# Patient Record
Sex: Male | Born: 1950 | Race: White | Hispanic: No | Marital: Married | Smoking: Former smoker
Health system: Southern US, Community
[De-identification: ages and names within clinical notes are randomized; demographics above are authoritative.]

## PROBLEM LIST (undated history)

## (undated) DIAGNOSIS — C61 Malignant neoplasm of prostate: Secondary | ICD-10-CM

## (undated) DIAGNOSIS — F419 Anxiety disorder, unspecified: Secondary | ICD-10-CM

## (undated) DIAGNOSIS — G4730 Sleep apnea, unspecified: Secondary | ICD-10-CM

## (undated) DIAGNOSIS — C801 Malignant (primary) neoplasm, unspecified: Secondary | ICD-10-CM

## (undated) DIAGNOSIS — F3289 Other specified depressive episodes: Secondary | ICD-10-CM

## (undated) DIAGNOSIS — E039 Hypothyroidism, unspecified: Secondary | ICD-10-CM

## (undated) DIAGNOSIS — F329 Major depressive disorder, single episode, unspecified: Secondary | ICD-10-CM

## (undated) DIAGNOSIS — E1165 Type 2 diabetes mellitus with hyperglycemia: Secondary | ICD-10-CM

## (undated) DIAGNOSIS — M109 Gout, unspecified: Secondary | ICD-10-CM

## (undated) DIAGNOSIS — I10 Essential (primary) hypertension: Secondary | ICD-10-CM

## (undated) DIAGNOSIS — IMO0001 Reserved for inherently not codable concepts without codable children: Secondary | ICD-10-CM

## (undated) DIAGNOSIS — M1990 Unspecified osteoarthritis, unspecified site: Secondary | ICD-10-CM

## (undated) DIAGNOSIS — I2510 Atherosclerotic heart disease of native coronary artery without angina pectoris: Secondary | ICD-10-CM

## (undated) DIAGNOSIS — E785 Hyperlipidemia, unspecified: Secondary | ICD-10-CM

## (undated) HISTORY — DX: Gout, unspecified: M10.9

## (undated) HISTORY — PX: KNEE SURGERY: SHX244

## (undated) HISTORY — DX: Other specified depressive episodes: F32.89

## (undated) HISTORY — PX: OTHER SURGICAL HISTORY: SHX169

## (undated) HISTORY — DX: Type 2 diabetes mellitus with hyperglycemia: E11.65

## (undated) HISTORY — DX: Reserved for inherently not codable concepts without codable children: IMO0001

## (undated) HISTORY — DX: Atherosclerotic heart disease of native coronary artery without angina pectoris: I25.10

## (undated) HISTORY — DX: Essential (primary) hypertension: I10

## (undated) HISTORY — DX: Sleep apnea, unspecified: G47.30

## (undated) HISTORY — DX: Major depressive disorder, single episode, unspecified: F32.9

## (undated) HISTORY — DX: Hyperlipidemia, unspecified: E78.5

## (undated) HISTORY — DX: Morbid (severe) obesity due to excess calories: E66.01

---

## 1999-12-14 ENCOUNTER — Encounter (HOSPITAL_COMMUNITY): Admission: RE | Admit: 1999-12-14 | Discharge: 2000-03-13 | Payer: Self-pay | Admitting: Family Medicine

## 2000-01-31 ENCOUNTER — Encounter: Admission: RE | Admit: 2000-01-31 | Discharge: 2000-04-30 | Payer: Self-pay | Admitting: Orthopedic Surgery

## 2000-03-13 ENCOUNTER — Encounter (HOSPITAL_COMMUNITY): Admission: RE | Admit: 2000-03-13 | Discharge: 2000-06-11 | Payer: Self-pay | Admitting: Family Medicine

## 2000-06-19 ENCOUNTER — Encounter (HOSPITAL_COMMUNITY): Admission: RE | Admit: 2000-06-19 | Discharge: 2000-09-17 | Payer: Self-pay | Admitting: Family Medicine

## 2000-09-17 ENCOUNTER — Encounter (HOSPITAL_COMMUNITY): Admission: RE | Admit: 2000-09-17 | Discharge: 2000-12-16 | Payer: Self-pay | Admitting: Family Medicine

## 2002-10-07 ENCOUNTER — Encounter: Payer: Self-pay | Admitting: Family Medicine

## 2002-10-07 ENCOUNTER — Ambulatory Visit (HOSPITAL_COMMUNITY): Admission: RE | Admit: 2002-10-07 | Discharge: 2002-10-07 | Payer: Self-pay | Admitting: Family Medicine

## 2004-11-02 ENCOUNTER — Ambulatory Visit (HOSPITAL_COMMUNITY): Admission: RE | Admit: 2004-11-02 | Discharge: 2004-11-02 | Payer: Self-pay | Admitting: Surgery

## 2005-02-20 HISTORY — PX: SLEEVE GASTROPLASTY: SHX1101

## 2006-12-28 ENCOUNTER — Ambulatory Visit: Payer: Self-pay | Admitting: Gastroenterology

## 2007-01-10 ENCOUNTER — Ambulatory Visit (HOSPITAL_COMMUNITY): Admission: RE | Admit: 2007-01-10 | Discharge: 2007-01-10 | Payer: Self-pay | Admitting: Gastroenterology

## 2007-01-10 ENCOUNTER — Encounter: Payer: Self-pay | Admitting: Gastroenterology

## 2007-01-14 ENCOUNTER — Ambulatory Visit: Payer: Self-pay | Admitting: Gastroenterology

## 2007-04-15 DIAGNOSIS — F411 Generalized anxiety disorder: Secondary | ICD-10-CM | POA: Insufficient documentation

## 2007-04-15 DIAGNOSIS — F329 Major depressive disorder, single episode, unspecified: Secondary | ICD-10-CM

## 2007-04-15 DIAGNOSIS — J45909 Unspecified asthma, uncomplicated: Secondary | ICD-10-CM | POA: Insufficient documentation

## 2007-04-15 DIAGNOSIS — D126 Benign neoplasm of colon, unspecified: Secondary | ICD-10-CM

## 2007-04-15 DIAGNOSIS — E119 Type 2 diabetes mellitus without complications: Secondary | ICD-10-CM

## 2007-04-15 DIAGNOSIS — I2510 Atherosclerotic heart disease of native coronary artery without angina pectoris: Secondary | ICD-10-CM | POA: Insufficient documentation

## 2007-04-15 DIAGNOSIS — I10 Essential (primary) hypertension: Secondary | ICD-10-CM

## 2007-04-15 DIAGNOSIS — G4730 Sleep apnea, unspecified: Secondary | ICD-10-CM | POA: Insufficient documentation

## 2007-04-15 DIAGNOSIS — F3289 Other specified depressive episodes: Secondary | ICD-10-CM | POA: Insufficient documentation

## 2008-03-25 ENCOUNTER — Telehealth: Payer: Self-pay | Admitting: Gastroenterology

## 2010-05-09 ENCOUNTER — Inpatient Hospital Stay (HOSPITAL_COMMUNITY)
Admission: AD | Admit: 2010-05-09 | Discharge: 2010-05-14 | DRG: 607 | Disposition: A | Discharge status: Home / Self Care | Payer: 59 | Source: Ambulatory Visit | Attending: Family Medicine | Admitting: Family Medicine

## 2010-05-09 DIAGNOSIS — Z87891 Personal history of nicotine dependence: Secondary | ICD-10-CM

## 2010-05-09 DIAGNOSIS — Y92009 Unspecified place in unspecified non-institutional (private) residence as the place of occurrence of the external cause: Secondary | ICD-10-CM

## 2010-05-09 DIAGNOSIS — F329 Major depressive disorder, single episode, unspecified: Secondary | ICD-10-CM | POA: Diagnosis present

## 2010-05-09 DIAGNOSIS — Z79899 Other long term (current) drug therapy: Secondary | ICD-10-CM

## 2010-05-09 DIAGNOSIS — Z6841 Body Mass Index (BMI) 40.0 and over, adult: Secondary | ICD-10-CM

## 2010-05-09 DIAGNOSIS — M109 Gout, unspecified: Secondary | ICD-10-CM | POA: Diagnosis present

## 2010-05-09 DIAGNOSIS — L03119 Cellulitis of unspecified part of limb: Secondary | ICD-10-CM | POA: Diagnosis present

## 2010-05-09 DIAGNOSIS — E119 Type 2 diabetes mellitus without complications: Secondary | ICD-10-CM | POA: Diagnosis present

## 2010-05-09 DIAGNOSIS — L02419 Cutaneous abscess of limb, unspecified: Secondary | ICD-10-CM | POA: Diagnosis present

## 2010-05-09 DIAGNOSIS — F3289 Other specified depressive episodes: Secondary | ICD-10-CM | POA: Diagnosis present

## 2010-05-09 DIAGNOSIS — L723 Sebaceous cyst: Secondary | ICD-10-CM | POA: Diagnosis present

## 2010-05-09 DIAGNOSIS — L270 Generalized skin eruption due to drugs and medicaments taken internally: Principal | ICD-10-CM | POA: Diagnosis present

## 2010-05-09 DIAGNOSIS — I10 Essential (primary) hypertension: Secondary | ICD-10-CM | POA: Diagnosis present

## 2010-05-09 DIAGNOSIS — E039 Hypothyroidism, unspecified: Secondary | ICD-10-CM | POA: Diagnosis present

## 2010-05-09 DIAGNOSIS — T364X5A Adverse effect of tetracyclines, initial encounter: Secondary | ICD-10-CM | POA: Diagnosis present

## 2010-05-09 DIAGNOSIS — G4733 Obstructive sleep apnea (adult) (pediatric): Secondary | ICD-10-CM | POA: Diagnosis present

## 2010-05-09 DIAGNOSIS — Z9884 Bariatric surgery status: Secondary | ICD-10-CM

## 2010-05-09 DIAGNOSIS — I872 Venous insufficiency (chronic) (peripheral): Secondary | ICD-10-CM | POA: Diagnosis present

## 2010-05-09 LAB — URINALYSIS, ROUTINE W REFLEX MICROSCOPIC
Bilirubin Urine: NEGATIVE
Glucose, UA: 250 mg/dL — AB
Nitrite: NEGATIVE
Protein, ur: NEGATIVE mg/dL
Specific Gravity, Urine: 1.017 (ref 1.005–1.030)
Urobilinogen, UA: 0.2 mg/dL (ref 0.0–1.0)

## 2010-05-09 LAB — CBC
MCV: 95.6 fL (ref 78.0–100.0)
RBC: 4.31 MIL/uL (ref 4.22–5.81)
RDW: 13.4 % (ref 11.5–15.5)
WBC: 9.1 10*3/uL (ref 4.0–10.5)

## 2010-05-09 LAB — COMPREHENSIVE METABOLIC PANEL
ALT: 11 U/L (ref 0–53)
AST: 16 U/L (ref 0–37)
Albumin: 3.4 g/dL — ABNORMAL LOW (ref 3.5–5.2)
Alkaline Phosphatase: 49 U/L (ref 39–117)
CO2: 29 mEq/L (ref 19–32)
Chloride: 103 mEq/L (ref 96–112)
Creatinine, Ser: 0.93 mg/dL (ref 0.4–1.5)
GFR calc Af Amer: >60 mL/min (ref >60)
Glucose, Bld: 149 mg/dL — ABNORMAL HIGH (ref 70–99)
Sodium: 138 mEq/L (ref 135–145)
Total Protein: 6.7 g/dL (ref 6.0–8.3)

## 2010-05-09 LAB — LIPID PANEL
HDL: 38 mg/dL — ABNORMAL LOW (ref >39)
Total CHOL/HDL Ratio: 4.1 RATIO
Triglycerides: 264 mg/dL — ABNORMAL HIGH (ref <150)
VLDL: 53 mg/dL — ABNORMAL HIGH (ref 0–40)

## 2010-05-10 DIAGNOSIS — M7989 Other specified soft tissue disorders: Secondary | ICD-10-CM

## 2010-05-10 LAB — BASIC METABOLIC PANEL
BUN: 17 mg/dL (ref 6–23)
GFR calc non Af Amer: >60 mL/min (ref >60)
Potassium: 4.7 mEq/L (ref 3.5–5.1)
Sodium: 141 mEq/L (ref 135–145)

## 2010-05-10 LAB — CBC
Platelets: 217 10*3/uL (ref 150–400)
RDW: 13.8 % (ref 11.5–15.5)
WBC: 7.5 10*3/uL (ref 4.0–10.5)

## 2010-05-10 LAB — URINE CULTURE
Colony Count: NO GROWTH
Culture: NO GROWTH

## 2010-05-10 LAB — HEMOGLOBIN A1C: Hgb A1c MFr Bld: 7.6 % — ABNORMAL HIGH (ref <5.7)

## 2010-05-10 LAB — MAGNESIUM: Magnesium: 1.8 mg/dL (ref 1.5–2.5)

## 2010-05-10 LAB — GLUCOSE, CAPILLARY: Glucose-Capillary: 154 mg/dL — ABNORMAL HIGH (ref 70–99)

## 2010-05-10 NOTE — H&P (Signed)
NAMELAURIER, JASPERSON NO.:  [id number]  MEDICAL RECORD NO.:  [id number]           PATIENT TYPE:  I  LOCATION:  5508                         FACILITY:  [facility]  PHYSICIAN:  Vania Rea, M.D. DATE OF BIRTH:  December 27, 1950  DATE OF ADMISSION:  05/09/2010 DATE OF DISCHARGE:                             HISTORY & PHYSICAL   PRIMARY CARE PHYSICIAN:  Molly Maduro A. Reade, M.D.  CHIEF COMPLAINT:  Rash and swelling of the right lower extremity for the past 2 weeks.  HISTORY OF PRESENT ILLNESS:  This is a 60 year old morbidly obese Caucasian gentleman with a history of diabetes controlled on diet and a history of what appears to be chronic lower extremity venous stasis and swelling who reports that for the past 2 weeks, he has been having progressive red rash and swelling of the right leg spreading from his ankle up to his knee.  The foot does feel very warm, but he has not documented any fevers.  He has a couple of time experienced shaking chills at night.  He has had no cough or cold.  Eventually called his primary care physician who prescribed doxycycline which he began taking on Friday.  However, the patient has not been improving and eventually the hospitalist service was called for direct admission from his doctor's office today.  The patient also reports the onset of a rash in both arms and his back for the past 3 days which seems to coincide with the onset of the use of doxycycline.  The patient has no history of recent trauma to the leg.  He has been getting his blood sugars checked occasionally and was recently over 200, however reports that his hemoglobin A1c when is checked yearly is usually around 6.  PAST MEDICAL HISTORY: 1. Diabetes controlled on diet, morbid obesity status post gastric     stapling 4 years ago. 2. Obstructive sleep apnea, uses his brother's CPAP machine since his     is broken. 3. History of gout. 4. History of polyarthropathy due to  osteoarthritis. 5. Hypothyroidism. 6. Depression. 7. Possible benign prostatic hypertrophy. 8. Hypertension.  PAST SURGICAL HISTORY:  Includes also right knee surgery many years ago, also includes stomach stapling 4 years ago as noted above.  MEDICATIONS: 1. Vicodin 5/500 2 tablets as needed twice a day for knee pain. 2. Allopurinol 300 mg daily. 3. Indomethacin 50 mg three times daily as needed. 4. Lisinopril 10 mg daily. 5. Citalopram 20 mg daily, has not taken this for about 3 weeks. 6. Levothyroxine 200 mcg daily. 7. Doxycycline 100 mg twice daily for the past 3 days.  ALLERGIES:  No known drug allergies.  SOCIAL HISTORY:  Denies tobacco, alcohol, or illicit drug use. Discontinued smoking over 25 years ago.  Does not do much exercise.  He is married and his wife is present interview with him.  FAMILY HISTORY:  Significant for a father who had peptic ulcer disease and also died of colon cancer.  Mother also had peptic ulcer disease and also had deep venous thrombosis.  Other than this his family history is unremarkable.  REVIEW OF SYSTEMS:  Significant for a fact that his weight was as high as 530 pounds prior to his gastric bypass surgery.  Today he is down to 426 pounds.  He gives his height as 5 feet 9 inches.  The patient also gives a history of many years of nocturia.  He wakes up about every hour and a half every night to pass urine, although she says his blood sugars are always well controlled and he does not think he has an enlarged prostate. 1. He reports that he tends to have pain in many joints, particularly     the right knee. 2. He did have a colonoscopy done about 3 years ago, which he reports     as being unremarkable. 3. Other than this, complete review of systems unremarkable.  PHYSICAL EXAMINATION:  GENERAL:  Pleasant, morbidly obese Caucasian gentleman in a recliner. VITAL SIGNS:  Temperature is 97.8.  His pulse is 97, respirations 18, blood pressure  116/64, saturating 97% on room air. HEENT:  His pupils are round and equal.  His mucous membranes are pink. Anicteric.  No cervical lymphadenopathy or thyromegaly. NECK:  He does have a very thick neck.  No carotid bruits. CHEST:  Clear to auscultation bilaterally. CARDIOVASCULAR:  Regular rhythm.  No murmur heard. ABDOMEN:  Massively obese, but soft, nontender.  There is no intertriginous Candida. EXTREMITIES:  He does have arthritic deformities of the knees.  There is mild edema on both legs.  He has dorsalis pedis pulses 1+ bilaterally. The feet are warm. SKIN:  Skin of the entire right leg below the knee is erythematous.  The surface is very cobbled and there is no weeping from the leg, appears to be a combination of inflammation and venous stasis changes.  It involves more the anterior of the leg rather than the posterior of the leg.  The left leg is less swollen and there is evidence of healed scarring of the anterior leg and hyperpigmented skin changes associated with venous stasis.  He has maculopapular rash on both forearms and elbows and down his back and also involving his scalp.  There is no rash on his chest. CENTRAL NERVOUS SYSTEM:  Cranial nerves II through XII are grossly intact.  He has no focal lateralizing signs.  DATA:  Because this gentleman is a direct admit, we have no labs or radiologic studies as yet.  ASSESSMENT: 1. Cellulitis of the right lower extremity. 2. Acute allergic reaction, questionable related to doxycycline. 3. Diabetes type 2, uncontrolled by history. 4. Hypertension. 5. Chronic venous stasis. 6. Morbid obesity. 7. Obstructive sleep apnea. 8. Hypothyroidism. 9. Gout. 10.Nocturia, questionable secondary to benign prostatic hypertrophy. 11.Depression.  PLAN: 1. We will admit this gentleman for management of his cellulitis.  We     will start him on vancomycin and because of the appearance of the     leg, which is somewhat suggestive of  strep infection.  We will go     ahead and add Rocephin.  Blood cultures have already been ordered. 2. He is having an acute allergic reaction.  We will discontinue     doxycycline presumptively.  He has itching of his back and his     hands, we will give Benadryl now and then p.r.n. and we will start     him on daily Claritin. 3. We will also add Pepcid, but we will withhold steroids because of     his diabetes. 4. Since his diabetes is usually controlled on diet, we will  manage     him with a sliding scale insulin initially. 5. We will continue his hyperthyroid medication, but will withhold     NSAIDs and lisinopril until we have blood work back to assure [place]     that his renal function is normal. 6. We will consult physical therapist and occupational therapist, they     may be able to assist with management of his venous stasis. 7. We will also get Doppler ultrasound particularly of the right leg     to rule out deep venous thrombosis contributing to the swelling,     pain and redness. 8. Other plans as per orders.     Vania Rea, M.D.     LC/MEDQ  D:  05/09/2010  T:  05/09/2010  Job:  [id number]  cc:   [name], M.D.  Electronically Signed by Vania Rea M.D. on 05/10/2010 12:59:26 AM

## 2010-05-11 LAB — BASIC METABOLIC PANEL
BUN: 13 mg/dL (ref 6–23)
CO2: 26 mEq/L (ref 19–32)
Chloride: 101 mEq/L (ref 96–112)
GFR calc non Af Amer: >60 mL/min (ref >60)
Glucose, Bld: 163 mg/dL — ABNORMAL HIGH (ref 70–99)
Potassium: 4.4 mEq/L (ref 3.5–5.1)
Sodium: 136 mEq/L (ref 135–145)

## 2010-05-11 LAB — GLUCOSE, CAPILLARY: Glucose-Capillary: 160 mg/dL — ABNORMAL HIGH (ref 70–99)

## 2010-05-11 LAB — VANCOMYCIN, TROUGH: Vancomycin Tr: 20.9 ug/mL — ABNORMAL HIGH (ref 10.0–20.0)

## 2010-05-11 LAB — CBC
HCT: 38.4 % — ABNORMAL LOW (ref 39.0–52.0)
Hemoglobin: 12.8 g/dL — ABNORMAL LOW (ref 13.0–17.0)
MCHC: 33.3 g/dL (ref 30.0–36.0)
RBC: 4.01 MIL/uL — ABNORMAL LOW (ref 4.22–5.81)
WBC: 7.3 10*3/uL (ref 4.0–10.5)

## 2010-05-11 LAB — DIFFERENTIAL
Basophils Absolute: 0.1 10*3/uL (ref 0.0–0.1)
Lymphocytes Relative: 20 % (ref 12–46)
Monocytes Absolute: 0.7 10*3/uL (ref 0.1–1.0)
Neutro Abs: 4.6 10*3/uL (ref 1.7–7.7)
Neutrophils Relative %: 64 % (ref 43–77)

## 2010-05-12 LAB — COMPREHENSIVE METABOLIC PANEL
Albumin: 3.1 g/dL — ABNORMAL LOW (ref 3.5–5.2)
BUN: 12 mg/dL (ref 6–23)
Chloride: 104 mEq/L (ref 96–112)
Creatinine, Ser: 0.88 mg/dL (ref 0.4–1.5)
GFR calc non Af Amer: >60 mL/min (ref >60)
Total Bilirubin: 0.5 mg/dL (ref 0.3–1.2)

## 2010-05-12 LAB — GLUCOSE, CAPILLARY: Glucose-Capillary: 124 mg/dL — ABNORMAL HIGH (ref 70–99)

## 2010-05-12 LAB — CBC
MCH: 32 pg (ref 26.0–34.0)
MCV: 95.7 fL (ref 78.0–100.0)
Platelets: 211 10*3/uL (ref 150–400)
RBC: 4.16 MIL/uL — ABNORMAL LOW (ref 4.22–5.81)

## 2010-05-13 LAB — CBC
HCT: 40 % (ref 39.0–52.0)
MCH: 31.7 pg (ref 26.0–34.0)
MCV: 95.9 fL (ref 78.0–100.0)
Platelets: 214 10*3/uL (ref 150–400)
RBC: 4.17 MIL/uL — ABNORMAL LOW (ref 4.22–5.81)
WBC: 7.2 10*3/uL (ref 4.0–10.5)

## 2010-05-13 LAB — GLUCOSE, CAPILLARY
Glucose-Capillary: 146 mg/dL — ABNORMAL HIGH (ref 70–99)
Glucose-Capillary: 154 mg/dL — ABNORMAL HIGH (ref 70–99)
Glucose-Capillary: 149 mg/dL — ABNORMAL HIGH (ref 70–99)

## 2010-05-13 LAB — COMPREHENSIVE METABOLIC PANEL
Albumin: 3.1 g/dL — ABNORMAL LOW (ref 3.5–5.2)
Alkaline Phosphatase: 39 U/L (ref 39–117)
BUN: 10 mg/dL (ref 6–23)
Chloride: 101 mEq/L (ref 96–112)
Creatinine, Ser: 0.93 mg/dL (ref 0.4–1.5)
GFR calc non Af Amer: >60 mL/min (ref >60)
Glucose, Bld: 136 mg/dL — ABNORMAL HIGH (ref 70–99)
Potassium: 4.4 mEq/L (ref 3.5–5.1)
Total Bilirubin: 0.6 mg/dL (ref 0.3–1.2)

## 2010-05-13 LAB — WOUND CULTURE: Gram Stain: NONE SEEN

## 2010-05-14 LAB — CBC
HCT: 38.2 % — ABNORMAL LOW (ref 39.0–52.0)
Hemoglobin: 12.8 g/dL — ABNORMAL LOW (ref 13.0–17.0)
MCH: 31.8 pg (ref 26.0–34.0)
MCHC: 33.5 g/dL (ref 30.0–36.0)
MCV: 94.8 fL (ref 78.0–100.0)
RDW: 13.4 % (ref 11.5–15.5)

## 2010-05-14 LAB — GLUCOSE, CAPILLARY: Glucose-Capillary: 130 mg/dL — ABNORMAL HIGH (ref 70–99)

## 2010-05-16 LAB — CULTURE, BLOOD (ROUTINE X 2)
Culture  Setup Time: 201203200427
Culture: NO GROWTH

## 2010-05-19 NOTE — Discharge Summary (Signed)
Cameron Moran, Cameron Moran               ACCOUNT NO.:  [id number]  MEDICAL RECORD NO.:  [id number]           PATIENT TYPE:  I  LOCATION:  5508                         FACILITY:  [facility]  PHYSICIAN:  Cameron Koch, MD        DATE OF BIRTH:  1950-10-18  DATE OF ADMISSION:  05/09/2010 DATE OF DISCHARGE:                              DISCHARGE SUMMARY   DISCHARGE DIAGNOSES: 1. Possible drug-induced rash secondary to doxycycline. 2. Likely secondary infection with cellulitis of right lower     extremity. 3. Contact dermatitis/photosensitive eruption secondary doxycycline     upper extremities. 4. Milia versus back. 5. Diabetes mellitus type 2 poor control A1c 7.6-?needs meds? 6. Gout. 7. Depression. 8. Venous stasis lower extremity 9. Obstructive sleep apnea. 10.Remote history of gastric bypass surgery.  DISCHARGE MEDICATIONS:  Are as follows: 1. Keflex 500 mg 1 tablet b.i.d. for 4 days, 8 tablets given. 2. Benadryl 50 mg by mouth q.8 hourly take for 7 days, 21 tablets     given. 3. Eucerin cream one application topically as needed, quantity     sufficient for 7 days prescribed. 4. Hydroxyzine 50 mg by mouth every 6 hours as needed, quantity     sufficient given for 7 days. 5. Levothyroxine 250 mg by mouth daily before breakfast (this is an     increase in dose). 6. Loratadine 10 mg by mouth daily take for 10 days. 7. Triamcinolone topical 0.1% ointment topically to be taken twice     daily for 14 days. 8. Vicodin/hydrocodone 5/500 1 tablet by mouth three times daily as     needed to take 7 days, limited prescription 21 tablets.  MEDICATION:  Remain same: 1. Allopurinol 300 mg once daily. 2. Citalopram 20 mg 1 tablet by mouth daily. 3. Unasyn 15 mg by mouth daily. 4. Lisinopril 10 mg by mouth daily.  The patient instructed to stop the following medications levothyroxine 200 mg daily, doxycycline 100 mg by mouth daily.  BRIEF HISTORY AND PHYSICAL:  Please see full note by Cameron.  Vania Moran job number [id number]. Briefly, this is of 60 year old morbidly obese Caucasian gentleman history of diabetes and chronic lower venous stasis swelling who has been having progressive red rash starting in the right leg spreading  ankle up to the knee.  He did feel very warm on admission, but not document fever.  He had a couple times shaking chills at night.  No cough, cold.  He called his primary physician who prescribed doxycycline which he began taking on Friday prior to admission, however this is not improving.  Patient came as a direct admit from Cameron Moran office on day of admission May 09, 2010.  He reports onset of rash in both arms for past 2 days which seems to coincides with the use of doxycycline.  He has no history recent trauma to leg and had his blood sugars checked occasionally, which is over 200, however, reports that yearly when his A1c is checked it is usually around 6.0  PERTINENT ADMISSION FINDINGS:  Very thick neck Mallampati stage 3-4, arthritic deformities knees, mild edema to both  legs.  Dorsalis pedis bilaterally noted.  Skin of entire leg below the knee was erythematous, surface covered, no weakness in leg, appears to commonly have intermittent venous stasis changes in both anterior and posterior leg. Left leg is less swollen.  There is evidence of healed scar anterior leg and changes of venous stasis.  There is maculopapular rash both forearms and elbows down his back and also involving the scalp.  No rash on chest.  HOSPITAL COURSE:  According issue: 1. Rash and itching.  The patient was placed initially on Benadryl,     hydroxyzine, Claritin as this is probably an acute allergic     reaction which may have caused him these issues.  I did consult dermatologist, Cameron Moran, who recommended to continue on same medication also in addition to this triamcinolone 0.1% ointment for 2 weeks and recommended to follow up with her.  1. Cellulitis.  The  patient was treated initially empirically with     Zosyn and vancomycin for 5 days until May 13, 2010.  He was     discharged home on Keflex complete a 10-day course of antibiotics     with Keflex 500 b.i.d. 2. Diabetes mellitus.  He has poor control as noted by A1c documenting     at 7.6.  The patient is amendable to taking oral medications     although claims to be fearful of insulin.  I have made the     recommendation to him and reinforced to him that he will likely     need to be on medications once he is discharged.  The patient is     understanding of same and Cameron Moran will discuss this plan.     The patient may be able to work on diet and exercise initially. 3. Gout.  This is stable in hospital. 4. Depression.  The patient was kept on Celexa 20 mg daily. 5. Venous stasis.  His legs are elevated.  He probably would benefit     from compression stockings if we could find size that would fit     him.  He also has chronic leg pain and had DVT ruled out by venous     Dopplers in hospital. 6. Obstructive sleep apnea.  He is to continue his CPAP, his nasal     intermittent  positive-pressure ventilation at bedtime.  The    patient discharged home in stable state.  Temperature 97, blood pressure 120-131/80-84, pulse 77, respirations 21- 24 and 96% on room air.  He had wound cultures done which were negative on May 10, 2010 and blood cultures which were negative on May 09, 2010.          ______________________________ Cameron Koch, MD     JS/MEDQ  D:  05/14/2010  T:  05/14/2010  Job:  [id number]  cc:   Cameron Moran, M.D. Cameron Moran, M.D.  Electronically Signed by Cameron Koch MD on 05/19/2010 04:16:30 AM

## 2010-07-05 NOTE — Assessment & Plan Note (Signed)
[facility]                         GASTROENTEROLOGY OFFICE NOTE   NAME:Purpura, MCKALE HAFFEY                      MRN:          [id number]  DATE:12/28/2006                            DOB:          [birth year]    PRIMARY CARE PHYSICIAN:  Dr. Elias Else   REASON FOR REFERRAL:  Colon cancer screening, morbid obesity.   HPI:  Mr. Granier is a very pleasant 60 year old man, whose father died  of colon cancer, who was scheduled for evaluation for screening  colonoscopy.  He is morbidly obese with a weight currently of 452 pounds  and, as such, it would not be possible to do colonoscopy for him in our  endoscopy center.  He has no constipation or diarrhea, no bleeding in  his bowels.   REVIEW OF SYSTEMS:  Notable for 60-pound weight-loss since a lap sleeve  procedure was performed on him one to two years ago.  The rest of review  of systems is essentially negative as noted on his nursing intake sheet.   PAST MEDICAL HISTORY:  Morbid obesity, laparoscopic sleeve procedure,  sleep apnea, uses CPAP machine; hypothyroidism, diabetes.   CURRENT MEDICINES:  Lisinopril, allopurinol, indomethacin, Synthroid,  Vicodin.   ALLERGIES:  No known drug allergies.   SOCIAL HISTORY:  Married with two grown daughters, lives with his wife,  works in Air[employer] pilot for [employer], nonsmoker, drinks rarely.   FAMILY HISTORY:  His father died of colon cancer, grandmother of breast  cancer.   PHYSICAL EXAM:  He is 5 feet 9 inches, 452 pounds, blood pressure  140/90, pulse 76.  CONSTITUTIONAL:  Generally well-appearing except for morbid obesity.  NEUROLOGIC:  Alert and oriented times three.  EYES:  Extraocular movements intact.  MOUTH:  Oropharynx moist, no lesions.  NECK:  Supple, no lymphadenopathy.  CARDIOVASCULAR/HEART:  Regular rate and rhythm.  LUNGS:  Clear to auscultation bilaterally.  ABDOMEN:  Obese, soft, nontender, nondistended.  Normal bowel sounds.  EXTREMITIES:   No lower extremity edema.  SKIN:  No rashes or lesions on visible extremities.   ASSESSMENT AND PLAN:  Patient is a 60 year old man with family history  of colon cancer, morbid obesity.   We will arrange for him to have colonoscopy performed at his soonest  convenience.  This will be done at the hospital, due to his weight.  He  understands that, even if it is completely normal, he will need repeat  colonoscopy in five years, due to his family history of colon cancer.  I  see no reason for any further blood tests or imaging studies prior to  that.  He will bring his CPAP mask with him.     Rachael Fee, MD  Electronically Signed    DPJ/MedQ  DD: 12/28/2006  DT: 12/28/2006  Job #: [id number]   cc:   [name], M.D.
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
## 2011-05-04 ENCOUNTER — Ambulatory Visit: Payer: 59 | Admitting: *Deleted

## 2011-05-10 ENCOUNTER — Ambulatory Visit: Payer: 59 | Admitting: *Deleted

## 2011-06-07 ENCOUNTER — Encounter: Payer: Self-pay | Admitting: *Deleted

## 2011-06-07 ENCOUNTER — Encounter: Payer: 59 | Attending: Family Medicine | Admitting: *Deleted

## 2011-06-07 VITALS — Ht 69.0 in | Wt >= 6400 oz

## 2011-06-07 DIAGNOSIS — Z713 Dietary counseling and surveillance: Secondary | ICD-10-CM | POA: Insufficient documentation

## 2011-06-07 DIAGNOSIS — E119 Type 2 diabetes mellitus without complications: Secondary | ICD-10-CM | POA: Insufficient documentation

## 2011-06-07 DIAGNOSIS — E669 Obesity, unspecified: Secondary | ICD-10-CM | POA: Insufficient documentation

## 2011-06-07 DIAGNOSIS — I10 Essential (primary) hypertension: Secondary | ICD-10-CM | POA: Insufficient documentation

## 2011-06-07 NOTE — Patient Instructions (Addendum)
Diabetes Self-Care  Feet  Daily inspection of feet (look for red/whit spots, signs of infection, blisters, dryness)  Dryness of skin: Bathe daily and use lotion containing lanolin  Dry/Callus Areas: Use pumice stone to remove callus tissue  Diabetic socks (Thick, mixed cotton/acrylic)  Diabetic shoes (Need MD prescription, Check with insurance for coverage)  Eyes  Yearly dilated eye exam  Teeth and Mouth  Brush and Floss Daily  Cleaning/Exam every 6 months  Monitoring Blood Glucose  Check 2 times daily Fasting glucose (80-120) Pre-Meal (80-120) 2 hrs after the first bit of a meal (80-160) Bedtime (100-140)  HgbA1C  Check every 3-6 months per MD  Goal <7%  Medical Care American Diabetes Association recommends MD visits every 3-6 months  Nutrition  Carbohydrates  Convert to glucose  Consume carbohydrates as a part of meals and snacks  Avoid the use of concentrated sweets as they promote the rapid rise in blood glucose (sweet tea, juice, regular soda, and sweetened beverages)  Choose sugar substitutes for sweetening  Fiber  Slow the uptake of glucose from carbohydrate  Fibrous foods include whole grain breads, cereals, vegetables, beans, peas, and lentils  Use food label to determine foods highest in fiber  Breads should have >2 grams per serving  Cereals should have >3 grams per serving  Sugar  Aim for 0-9 grams per serving  Choose your use of foods containing higher sugar levels wisely  Counting carbohydrates  Breakfast =  60 grams carbohydrate (4 carb choices)  AM Snack = 15-20 grams carbohydrate + protein (1 carb choices)  Lunch =  60 grams carbohydrate (4 carb choices)  PM Snack = 15-20 grams carbohydrate + protein (1 carb choices)  Dinner =  60 grams carbohydrate (4 carb choices)  Bedtime Snack = 15-20 grams carbohydrate + protein (1 carb choices)  OR   Plate Method for Carbohydrate Control  Use an 9 inch plate  Make 1/2  plate non-starchy vegetables  Make 1/4 plate carbohydrate choice (high fiber)  Make 1/4 plate lean protein  Non-Starchy Vegetables  High in fiber and low in starches/calories  Try to have a large serving a lunch/dinner  Considered a "FREE" choice  High in vitamins, minerals, and anti-oxidants  Deep rich colored vegetables  Proteins  Build and repair tissues and do not contain glucose  Watch for added carbohydrate to protein (such as gravy, breading, sauces)  Choose lean proteins  Bake, broil, grill, and roast proteins,   Serving size = Size of the palm of the hand  Have a protein source at all meals and snacks  Fats  Keep at 1-2 servings per meal  Measure fat servings  Choose reduced fat or low-fat varieties of salad dressings and condiments  Use food label for serving sizes

## 2011-06-07 NOTE — Progress Notes (Signed)
Medical Nutrition Therapy:  Appt start time: 0930   End time: 1030.  Assessment:  Primary concerns today: T2DM, Obesity, HTN.  Pt with recent A1c of 7.9% (previously 9.2%) here today for assessment. States he has prior education "years ago", but unsure how to count CHO. Reports he "sleeps all day"; he lays down to ease pressure on knees and falls asleep. Reports FBGs of 165-200 mg/dl and pre-prandial BGs of 145-150 mg/dl. Pt is 5 yrs s/p gastric sleeve (achieved ~90 lb wt loss) in 02/2006, though has gained it back. Poor food choices noted and include daily intake of refined high CHO foods (i.e. oatmeal cookies, honey buns, and apple fritters). Excessive caffeine intake noted as well. Urged pt to have f/u with surgeon regarding gastric sleeve. Discussed restarting pre-op or modified post-op diet. States he does not want revision of surgery because many of his family members also regained weight after bariatric surgery. Will plan to try CHO counting.   MEDICATIONS: See medication list; Reconciled with pt.     DIETARY INTAKE:  Usual eating pattern includes 3 meals and 2-3 high carb snacks per day.  24-hr recall:  S (5 AM):  12 c pot of coffee w/ cream, 1 tsp sugar, & 1 pkt splenda B ( AM): 3 eggs, 1 c grits, 2 pc sausage or bacon  Snk ( AM): refined CHO L ( PM): 2 [place] sandwiches w/ mayo, 16 oz sweet tea or milk Snk ( PM): refined CHO D ( PM):  2 ham and cheese sandwiches Snk ( PM): refined CHO Beverages: sweet tea, 2% milk, coffee, water  Usual physical activity: none  Estimated energy needs: 1800 calories 180-200 g carbohydrates  Progress Towards Goal(s):  In progress.   Nutritional Diagnosis:  [place]-2.1 Inpaired nutrition utilization related to glucose metabolism as evidenced by rcent A1c of 7.9% and MD referral for T2DM.    Intervention/Goals:  Nutrition Education - See patient instructions.  Handouts given during visit include:  Living Well with Diabetes - [employer]  60g CHO meal  ideas  Target Blood Glucose Levels  Low CHO Snack List  Monitoring/Evaluation:  Dietary intake, exercise, A1c (as available), BG trends, and body weight in 6 week(s).

## 2011-06-11 ENCOUNTER — Encounter: Payer: Self-pay | Admitting: *Deleted

## 2011-08-03 ENCOUNTER — Ambulatory Visit: Payer: 59 | Admitting: *Deleted

## 2011-09-04 ENCOUNTER — Ambulatory Visit: Payer: 59 | Admitting: *Deleted

## 2011-12-22 ENCOUNTER — Encounter: Payer: Self-pay | Admitting: Gastroenterology

## 2012-06-12 ENCOUNTER — Encounter: Payer: Self-pay | Admitting: Gastroenterology

## 2016-04-19 ENCOUNTER — Emergency Department (HOSPITAL_COMMUNITY)
Admission: EM | Admit: 2016-04-19 | Discharge: 2016-04-20 | Disposition: A | Discharge status: Home / Self Care | Payer: Medicare Other | Attending: Emergency Medicine | Admitting: Emergency Medicine

## 2016-04-19 ENCOUNTER — Encounter (HOSPITAL_COMMUNITY): Payer: Self-pay | Admitting: Emergency Medicine

## 2016-04-19 ENCOUNTER — Emergency Department (HOSPITAL_COMMUNITY): Payer: Medicare Other

## 2016-04-19 DIAGNOSIS — I10 Essential (primary) hypertension: Secondary | ICD-10-CM | POA: Insufficient documentation

## 2016-04-19 DIAGNOSIS — R531 Weakness: Secondary | ICD-10-CM | POA: Diagnosis not present

## 2016-04-19 DIAGNOSIS — Y9389 Activity, other specified: Secondary | ICD-10-CM | POA: Diagnosis not present

## 2016-04-19 DIAGNOSIS — Y999 Unspecified external cause status: Secondary | ICD-10-CM | POA: Diagnosis not present

## 2016-04-19 DIAGNOSIS — I2510 Atherosclerotic heart disease of native coronary artery without angina pectoris: Secondary | ICD-10-CM | POA: Insufficient documentation

## 2016-04-19 DIAGNOSIS — Y9289 Other specified places as the place of occurrence of the external cause: Secondary | ICD-10-CM | POA: Diagnosis not present

## 2016-04-19 DIAGNOSIS — G8929 Other chronic pain: Secondary | ICD-10-CM | POA: Diagnosis not present

## 2016-04-19 DIAGNOSIS — J45909 Unspecified asthma, uncomplicated: Secondary | ICD-10-CM | POA: Insufficient documentation

## 2016-04-19 DIAGNOSIS — R0602 Shortness of breath: Secondary | ICD-10-CM | POA: Insufficient documentation

## 2016-04-19 DIAGNOSIS — Z87891 Personal history of nicotine dependence: Secondary | ICD-10-CM | POA: Diagnosis not present

## 2016-04-19 DIAGNOSIS — M25561 Pain in right knee: Secondary | ICD-10-CM | POA: Insufficient documentation

## 2016-04-19 DIAGNOSIS — M25562 Pain in left knee: Secondary | ICD-10-CM | POA: Insufficient documentation

## 2016-04-19 DIAGNOSIS — Z6841 Body Mass Index (BMI) 40.0 and over, adult: Secondary | ICD-10-CM | POA: Diagnosis not present

## 2016-04-19 DIAGNOSIS — E119 Type 2 diabetes mellitus without complications: Secondary | ICD-10-CM | POA: Diagnosis not present

## 2016-04-19 DIAGNOSIS — Z7984 Long term (current) use of oral hypoglycemic drugs: Secondary | ICD-10-CM | POA: Insufficient documentation

## 2016-04-19 DIAGNOSIS — W06XXXA Fall from bed, initial encounter: Secondary | ICD-10-CM | POA: Diagnosis not present

## 2016-04-19 DIAGNOSIS — Z79899 Other long term (current) drug therapy: Secondary | ICD-10-CM | POA: Diagnosis not present

## 2016-04-19 LAB — CBC WITH DIFFERENTIAL/PLATELET
BASOS ABS: 0 10*3/uL (ref 0.0–0.1)
Basophils Relative: 0 %
Eosinophils Absolute: 0.3 10*3/uL (ref 0.0–0.7)
Eosinophils Relative: 3 %
HEMATOCRIT: 41.5 % (ref 39.0–52.0)
Hemoglobin: 13.2 g/dL (ref 13.0–17.0)
LYMPHS PCT: 12 %
Lymphs Abs: 1.2 10*3/uL (ref 0.7–4.0)
MCH: 31.2 pg (ref 26.0–34.0)
MCHC: 31.8 g/dL (ref 30.0–36.0)
MCV: 98.1 fL (ref 78.0–100.0)
Monocytes Absolute: 0.6 10*3/uL (ref 0.1–1.0)
Monocytes Relative: 6 %
Neutro Abs: 8 10*3/uL — ABNORMAL HIGH (ref 1.7–7.7)
Neutrophils Relative %: 79 %
PLATELETS: 157 10*3/uL (ref 150–400)
RBC: 4.23 MIL/uL (ref 4.22–5.81)
RDW: 13.7 % (ref 11.5–15.5)
WBC: 10.1 10*3/uL (ref 4.0–10.5)

## 2016-04-19 LAB — I-STAT ARTERIAL BLOOD GAS, ED
ACID-BASE EXCESS: 4 mmol/L — AB (ref 0.0–2.0)
BICARBONATE: 30.6 mmol/L — AB (ref 20.0–28.0)
O2 SAT: 90 %
PO2 ART: 61 mmHg — AB (ref 83.0–108.0)
TCO2: 32 mmol/L (ref 0–100)
pCO2 arterial: 53.6 mmHg — ABNORMAL HIGH (ref 32.0–48.0)
pH, Arterial: 7.364 (ref 7.350–7.450)

## 2016-04-19 LAB — COMPREHENSIVE METABOLIC PANEL
ALT: 13 U/L — AB (ref 17–63)
AST: 25 U/L (ref 15–41)
Albumin: 3.9 g/dL (ref 3.5–5.0)
Alkaline Phosphatase: 41 U/L (ref 38–126)
Anion gap: 11 (ref 5–15)
BILIRUBIN TOTAL: 0.7 mg/dL (ref 0.3–1.2)
BUN: 10 mg/dL (ref 6–20)
CHLORIDE: 98 mmol/L — AB (ref 101–111)
CO2: 29 mmol/L (ref 22–32)
Calcium: 9.9 mg/dL (ref 8.9–10.3)
Creatinine, Ser: 1.06 mg/dL (ref 0.61–1.24)
GFR calc Af Amer: >60 mL/min (ref >60)
GLUCOSE: 114 mg/dL — AB (ref 65–99)
Potassium: 4.6 mmol/L (ref 3.5–5.1)
Sodium: 138 mmol/L (ref 135–145)
Total Protein: 7.2 g/dL (ref 6.5–8.1)

## 2016-04-19 LAB — CBG MONITORING, ED: GLUCOSE-CAPILLARY: 114 mg/dL — AB (ref 65–99)

## 2016-04-19 LAB — URINALYSIS, ROUTINE W REFLEX MICROSCOPIC
BACTERIA UA: NONE SEEN
BILIRUBIN URINE: NEGATIVE
Glucose, UA: NEGATIVE mg/dL
Hgb urine dipstick: NEGATIVE
KETONES UR: NEGATIVE mg/dL
LEUKOCYTES UA: NEGATIVE
Nitrite: NEGATIVE
PH: 7 (ref 5.0–8.0)
PROTEIN: NEGATIVE mg/dL
Specific Gravity, Urine: 1.008 (ref 1.005–1.030)

## 2016-04-19 LAB — I-STAT TROPONIN, ED: TROPONIN I, POC: 0 ng/mL (ref 0.00–0.08)

## 2016-04-19 LAB — BRAIN NATRIURETIC PEPTIDE: B Natriuretic Peptide: 76.1 pg/mL (ref 0.0–100.0)

## 2016-04-19 MED ORDER — LEVOTHYROXINE SODIUM 125 MCG PO TABS
250.0000 ug | ORAL_TABLET | Freq: Every day | ORAL | Status: DC
  Filled 2016-04-19: qty 2

## 2016-04-19 MED ORDER — CITALOPRAM HYDROBROMIDE 10 MG PO TABS
20.0000 mg | ORAL_TABLET | Freq: Every day | ORAL | Status: DC
  Administered 2016-04-19 – 2016-04-20 (×2): 20 mg via ORAL
  Filled 2016-04-19 (×2): qty 2

## 2016-04-19 MED ORDER — ALLOPURINOL 300 MG PO TABS
300.0000 mg | ORAL_TABLET | Freq: Every day | ORAL | Status: DC
  Administered 2016-04-19 – 2016-04-20 (×2): 300 mg via ORAL
  Filled 2016-04-19 (×3): qty 1

## 2016-04-19 MED ORDER — HYDROCODONE-ACETAMINOPHEN 5-325 MG PO TABS
2.0000 | ORAL_TABLET | Freq: Once | ORAL | Status: AC
  Administered 2016-04-19: 2 via ORAL
  Filled 2016-04-19: qty 2

## 2016-04-19 MED ORDER — METFORMIN HCL 500 MG PO TABS
1000.0000 mg | ORAL_TABLET | Freq: Two times a day (BID) | ORAL | Status: DC
  Administered 2016-04-19 – 2016-04-20 (×3): 1000 mg via ORAL
  Filled 2016-04-19 (×3): qty 2

## 2016-04-19 MED ORDER — LEVOTHYROXINE SODIUM 125 MCG PO TABS
250.0000 ug | ORAL_TABLET | Freq: Every day | ORAL | Status: DC
  Administered 2016-04-19 – 2016-04-20 (×2): 250 ug via ORAL
  Filled 2016-04-19 (×3): qty 2

## 2016-04-19 MED ORDER — LISINOPRIL 10 MG PO TABS
10.0000 mg | ORAL_TABLET | Freq: Every day | ORAL | Status: DC
  Administered 2016-04-19 – 2016-04-20 (×2): 10 mg via ORAL
  Filled 2016-04-19 (×2): qty 1

## 2016-04-19 MED ORDER — LINAGLIPTIN 5 MG PO TABS
5.0000 mg | ORAL_TABLET | Freq: Every day | ORAL | Status: DC
  Administered 2016-04-19 – 2016-04-20 (×2): 5 mg via ORAL
  Filled 2016-04-19 (×3): qty 1

## 2016-04-19 MED ORDER — INDOMETHACIN 25 MG PO CAPS
50.0000 mg | ORAL_CAPSULE | Freq: Three times a day (TID) | ORAL | Status: DC
  Administered 2016-04-19 – 2016-04-20 (×4): 50 mg via ORAL
  Filled 2016-04-19 (×4): qty 2

## 2016-04-19 MED ORDER — FUROSEMIDE 20 MG PO TABS
40.0000 mg | ORAL_TABLET | Freq: Once | ORAL | Status: AC
  Administered 2016-04-19: 40 mg via ORAL
  Filled 2016-04-19: qty 2

## 2016-04-19 MED ORDER — ALBUTEROL SULFATE HFA 108 (90 BASE) MCG/ACT IN AERS
4.0000 | INHALATION_SPRAY | RESPIRATORY_TRACT | Status: DC | PRN
  Administered 2016-04-19: 4 via RESPIRATORY_TRACT
  Filled 2016-04-19: qty 6.7

## 2016-04-19 NOTE — ED Notes (Signed)
PT was moved to hospital bed

## 2016-04-19 NOTE — ED Notes (Addendum)
Pt oob to chair at bedside moved himself according to wife.

## 2016-04-19 NOTE — ED Notes (Signed)
Pt offered bedpan. Pt is adamant that he must be wheeled to br . Explained to pt safety concerns about getting him up. Bedpan again offered.

## 2016-04-19 NOTE — Progress Notes (Signed)
04/19/16 1000  PT Visit Information  Last PT Received On 04/19/16  Assistance Needed +2  History of Present Illness 66 yo male who fell off the bed and sustained 2 falls in last 24 hours causing him to be transported to ED.  Has hypoxia, on chronic O2 use at home 2L.  PMHx:  asthma, CPAP use, CAD, DM, HTN, cellulitis RLE with current drainage  Precautions  Precautions Fall (monitoring vitals)  Restrictions  Weight Bearing Restrictions No  Home Living  Family/patient expects to be discharged to: Skilled nursing facility  Prior Function  Level of Independence Needs assistance  Gait / Transfers Assistance Needed has 3 steps to enter house with no rails, SPC gait for very short trips  ADL's / Homemaking Assistance Needed wife is his caregiver at home  Communication  Communication No difficulties  Pain Assessment  Pain Assessment Faces  Faces Pain Scale 4  Pain Location groin area with sliding on gurney  Pain Descriptors / Indicators Sore  Pain Intervention(s) Limited activity within patient's tolerance;Monitored during session;Repositioned  Cognition  Arousal/Alertness Awake/alert  Behavior During Therapy Impulsive  Overall Cognitive Status No family/caregiver present to determine baseline cognitive functioning  General Comments pt is willing to try to stand but not aware of his physical limitations  Upper Extremity Assessment  Upper Extremity Assessment Overall WFL for tasks assessed  Lower Extremity Assessment  Lower Extremity Assessment Generalized weakness  Cervical / Trunk Assessment  Cervical / Trunk Assessment Normal  Bed Mobility  Overal bed mobility Needs Assistance  Bed Mobility Supine to Sit;Sit to Supine  Supine to sit Mod assist;+2 for physical assistance;+2 for safety/equipment;HOB elevated  Sit to supine Mod assist;+2 for physical assistance;+2 for safety/equipment (flattened gurney for ease of getting up to top of bed)  Transfers  Overall transfer level Needs  assistance  Equipment used 4-wheeled walker;2 person hand held assist  Transfers Sit to/from Stand  Sit to Stand Min assist;+2 physical assistance;+2 safety/equipment;From elevated surface  General transfer comment pt is not requiring much help but did get him into safe area where he could sit with no warning from standing  Ambulation/Gait  Ambulation/Gait assistance Min assist;+2 physical assistance;+2 safety/equipment  Ambulation Distance (Feet) 5 Feet  Assistive device Rolling walker (2 wheeled);2 person hand held assist  Gait Pattern/deviations Step-to pattern;Shuffle;Decreased stride length;Wide base of support;Trunk flexed  General Gait Details sidestepped up side of bed to get back in  Gait velocity reduced  Gait velocity interpretation Below normal speed for age/gender  Balance  Overall balance assessment History of Falls  General Comments  General comments (skin integrity, edema, etc.) pt was up to side of bed with 2 person help and then stood with caution at bedside, stepped up side and back to bed.  O2 sats declined sporadically, down to 85% at times.  on 4L O2  PT - End of Session  Equipment Utilized During Treatment Oxygen  Activity Tolerance Patient tolerated treatment well;Treatment limited secondary to medical complications (Comment);Patient limited by fatigue (low O2 sats at times)  Patient left in bed;with call bell/phone within reach;with nursing/sitter in room  Nurse Communication Mobility status;Other (comment) (discharge recommendations discussed)  PT Assessment  PT Recommendation/Assessment Patient needs continued PT services  PT Visit Diagnosis Muscle weakness (generalized) (M62.81);History of falling (Z91.81)  PT Problem List Decreased strength;Decreased range of motion;Decreased activity tolerance;Decreased balance;Decreased mobility;Decreased coordination;Decreased knowledge of use of DME;Decreased safety awareness;Cardiopulmonary status limiting  activity;Obesity;Decreased skin integrity;Pain  Barriers to Discharge Inaccessible home environment;Decreased caregiver support  Barriers  to Discharge Comments 3 steps no rails to enter, wife is home alone with pt  PT Plan  PT Frequency (ACUTE ONLY) Min 3X/week  PT Treatment/Interventions (ACUTE ONLY) DME instruction;Gait training;Stair training;Functional mobility training;Therapeutic activities;Therapeutic exercise;Balance training;Neuromuscular re-education;Patient/family education  AM-PAC PT "6 Clicks" Daily Activity Outcome Measure  Difficulty turning over in bed (including adjusting bedclothes, sheets and blankets)? 2  Difficulty moving from lying on back to sitting on the side of the bed?  2  Difficulty sitting down on and standing up from a chair with arms (e.g., wheelchair, bedside commode, etc,.)? 3  Help needed moving to and from a bed to chair (including a wheelchair)? 2  Help needed walking in hospital room? 2  Help needed climbing 3-5 steps with a railing?  1  6 Click Score 12  Mobility G Code  CL  PT Recommendation  Follow Up Recommendations SNF  PT equipment None recommended by PT (await discharge disposition, and needs to use RW now)  Individuals Consulted  Consulted and Agree with Results and Recommendations Patient  Acute Rehab PT Goals  Patient Stated Goal to get to walk again  PT Goal Formulation With patient  Time For Goal Achievement 05/03/16  Potential to Achieve Goals Good  PT Time Calculation  PT Start Time (ACUTE ONLY) 0958  PT Stop Time (ACUTE ONLY) 1039  PT Time Calculation (min) (ACUTE ONLY) 41 min  PT G-Codes **NOT FOR INPATIENT CLASS**  Functional Assessment Tool Used AM-PAC 6 Clicks Basic Mobility;Clinical judgement  Functional Limitation Mobility: Walking and moving around  Mobility: Walking and Moving Around Current Status [id number]) CL  Mobility: Walking and Moving Around Goal Status [id number]) CI  PT General Charges  $$ ACUTE PT VISIT 1 Procedure   PT Evaluation  $PT Eval Moderate Complexity 1 Procedure  PT Treatments  $Gait Training 8-22 mins  $Therapeutic Activity 8-22 mins  Written Expression  Dominant Hand Right   Mee Hives, PT MS Acute Rehab Dept. Number: [place] [facility] [place]

## 2016-04-19 NOTE — ED Notes (Signed)
Pt set up with supper tray and tolerating well.

## 2016-04-19 NOTE — ED Notes (Signed)
Attempted to ambulate patient. Patient stood up 3 times, sitting back down stating that his "knees hurt too much". Patient also stated that he felt SOB upon standing. Patient assisted back to bed, call light within reach.

## 2016-04-19 NOTE — ED Provider Notes (Signed)
This morning, Dr. Leonides Schanz had a plan in place for the patient to get social work consult and physical therapy consult. Plan at that time had been for discharge after these consults. Patient is morbidly obese and has significant difficulty functioning at home. He however had wished to be discharged and Dr. Leonides Schanz had a conversation with his wife regarding discharge to home. After consultations were done, and physical therapy and social work determined that the patient was significant compromised in ADLs and gait function that he was not a good candidate to be returned to home. Have a working on placement. Later this afternoon, the patient had changed his mind again and told his wife that he wished to come home. His wife is here. Family members difficulty assisting the patient due to his size and cannot physically remove him. Currently the patient, his wife and Education officer, museum are revisiting the patient's plan for SNF placement.    Charlesetta Shanks, MD 04/19/16 1600

## 2016-04-19 NOTE — ED Triage Notes (Signed)
BIB EMS from home, fall X2 tonight, fell out of bed. C/O gen weakness in legs over past few days. Hx cellulitis to RLE. O2 sats low on RA with EMS arrival upper 80's. Placed pt o2 [place] and sats up to low 90's. CBG 150.

## 2016-04-19 NOTE — ED Notes (Addendum)
Pt returns to bed and becomes short of breath with effort. Pt returns to 2l/[facility] that is constant for him. Resolves after return. Some wheezing noted.will infom dr. Colvin Caroli.

## 2016-04-19 NOTE — ED Notes (Signed)
t repositiooned with assist x 3

## 2016-04-19 NOTE — ED Notes (Signed)
Left expiratory Wheeze noted. PRN inhaler offered.

## 2016-04-19 NOTE — Progress Notes (Signed)
CSW has made referrals to SNFs. Awaiting bed offers at this time.    Lorrine Kin, MSW, LCSW [facility] ED/5M Clinical Social Worker [phone]

## 2016-04-19 NOTE — ED Notes (Addendum)
Pt food is at bedside. Pt is eating.

## 2016-04-19 NOTE — Discharge Instructions (Signed)
You were seen in the emergency department for generalized weakness and difficulty walking. I think that some of your difficulty walking secondary to your knee pain which is likely secondary to your obesity. Your labs today were normal. Your urine showed no sign of infection. Your chest x-ray did not show any pneumonia. I recommend close follow-up with your primary care physician. Please use your walker at all times at home.

## 2016-04-19 NOTE — Progress Notes (Addendum)
[facility]on [facility] has accepted pt for admission and will be able to accept him 3/1, after the facilty receive's the bariatric bed for him.  CSW discussed with wife, at length, and she is agreeable to plan.  CSW will continue to follow for disposition/NH tx.

## 2016-04-19 NOTE — Progress Notes (Signed)
CSW engaged with Patient at his bedside. CSW introduced self, role of CSW, and discussed discharge planning and safety concerns. CSW inquired about skilled nursing facility placement. Patient reports that he feels like he would be fine at home and reports that his wife is there to care for him. Of note, Patient is 530 pounds and wife is unable to pick Patient up when he falls on the floor. Patient presently unable to ambulate. CSW discussed quality of life if he returns home and is unable to ambulate but Patient reported that he feels he may need nursing home placement in the future, he does not feel that he is at this point and feels that this is just a "spell" and he will be able to get around at home. Patient also reports that his wife was crying and doesn't want him to leave her. Patient is agreeable to PT evaluation and RN Case Manager consult for possible home health.   Per MD notes, MD discussed with Patient's wife who is comfortable with plan for Patient to go home. CSW to further discuss with RN Case Manager and Patient to further solidify discharge plan.    Lorrine Kin, MSW, LCSW [facility] ED/109M Clinical Social Worker [phone]

## 2016-04-19 NOTE — Progress Notes (Signed)
CSW working SNF placement at this time.    Lorrine Kin, MSW, LCSW [facility] ED/24M Clinical Social Worker [phone]

## 2016-04-19 NOTE — NC FL2 (Signed)
[place] MEDICAID FL2 LEVEL OF CARE SCREENING TOOL     IDENTIFICATION  Patient Name: Cameron Moran Birthdate: [birth year] Sex: male Admission Date (Current Location): 04/19/2016  [facility] [place] Number:  Herbalist and Address:  The . [facility], [place] [address], [place], [place] [address]      Provider Number: [id number]  Attending Physician Name and Address:  No att. providers found  Relative Name and Phone Number:  Jarick Basista (Spouse) [phone]    Current Level of Care: Hospital Recommended Level of Care: [place] Prior Approval Number:    Date Approved/Denied:   PASRR Number: [id number]  Discharge Plan: SNF    Current Diagnoses: Patient Active Problem List   Diagnosis Date Noted  . COLONIC POLYPS, HYPERPLASTIC 04/15/2007  . DIABETES MELLITUS, TYPE II 04/15/2007  . MORBID OBESITY 04/15/2007  . ANXIETY 04/15/2007  . DEPRESSION 04/15/2007  . HYPERTENSION 04/15/2007  . CORONARY ARTERY DISEASE 04/15/2007  . ASTHMA 04/15/2007  . SLEEP APNEA 04/15/2007    Orientation RESPIRATION BLADDER Height & Weight     Self, Situation, Place, Time  O2 (2L Nasal Cannula) CPAP at bedtime Continent Weight: (!) 530 lb (240.4 kg) Height:     BEHAVIORAL SYMPTOMS/MOOD NEUROLOGICAL BOWEL NUTRITION STATUS   (NONE)  (NONE) Continent Diet (Carb Modified- Heart Healthy )  AMBULATORY STATUS COMMUNICATION OF NEEDS Skin   Extensive Assist Verbally Skin abrasions (cellulitis; Erythema)                       Personal Care Assistance Level of Assistance  Bathing, Feeding, Dressing Bathing Assistance: Maximum assistance Feeding assistance: Independent Dressing Assistance: Maximum assistance     Functional Limitations Info  Sight, Hearing, Speech Sight Info: Adequate Hearing Info: Adequate Speech Info: Adequate    SPECIAL CARE FACTORS FREQUENCY  PT (By licensed PT), OT (By licensed OT)                     Contractures Contractures Info: Not present    Additional Factors Info  Code Status, Allergies, Psychotropic Code Status Info: FULL Allergies Info: Doxycycline Calcium Psychotropic Info: Celexa         Current Medications (04/19/2016):  This is the current hospital active medication list Current Facility-Administered Medications  Medication Dose Route Frequency Provider Last Rate Last Dose  . allopurinol (ZYLOPRIM) tablet 300 mg  300 mg Oral Daily Kristen N Ward, DO   300 mg at 04/19/16 1020  . citalopram (CELEXA) tablet 20 mg  20 mg Oral Daily Kristen N Ward, DO   20 mg at 04/19/16 1020  . indomethacin (INDOCIN) capsule 50 mg  50 mg Oral TID WC Kristen N Ward, DO   50 mg at 04/19/16 0741  . levothyroxine (SYNTHROID, LEVOTHROID) tablet 250 mcg  250 mcg Oral QAC breakfast Kristen N Ward, DO   250 mcg at 04/19/16 1019  . linagliptin (TRADJENTA) tablet 5 mg  5 mg Oral Daily Kristen N Ward, DO   5 mg at 04/19/16 1020  . lisinopril (PRINIVIL,ZESTRIL) tablet 10 mg  10 mg Oral Daily Kristen N Ward, DO   10 mg at 04/19/16 1020  . metFORMIN (GLUCOPHAGE) tablet 1,000 mg  1,000 mg Oral BID WC Kristen N Ward, DO   1,000 mg at 04/19/16 0740   Current Outpatient Prescriptions  Medication Sig Dispense Refill  . allopurinol (ZYLOPRIM) 300 MG tablet Take 300 mg by mouth daily.    Marland Kitchen ALPRAZolam Duanne Moron)  0.25 MG tablet Take 0.25 mg by mouth 2 (two) times daily as needed for anxiety.    Marland Kitchen buPROPion (WELLBUTRIN XL) 150 MG 24 hr tablet Take 1 tablet by mouth daily.    . citalopram (CELEXA) 20 MG tablet Take 20 mg by mouth daily.    Marland Kitchen HYDROcodone-acetaminophen (NORCO) 10-325 MG tablet Take 1 tablet by mouth every 6 (six) hours as needed.    . indomethacin (INDOCIN) 50 MG capsule Take 50 mg by mouth 3 (three) times daily with meals.    Marland Kitchen levothyroxine (SYNTHROID, LEVOTHROID) 125 MCG tablet Take 250 mcg by mouth daily with breakfast.    . lisinopril (PRINIVIL,ZESTRIL) 10 MG tablet Take 10 mg by mouth daily.     . metFORMIN (GLUCOPHAGE) 1000 MG tablet Take 1,000 mg by mouth 2 (two) times daily with a meal.    . sitaGLIPtin (JANUVIA) 100 MG tablet Take 100 mg by mouth daily.    Marland Kitchen triamcinolone cream (KENALOG) 0.1 % Apply 1 application topically 2 (two) times daily.    . hydrocodone-acetaminophen (LORCET-HD) 5-500 MG per capsule Take 2 capsules by mouth 2 (two) times daily.       Discharge Medications: Please see discharge summary for a list of discharge medications.  Relevant Imaging Results:  Relevant Lab Results:   Additional Information SS# [id number]; Patient is 530 pounds  Lind Covert, [place]

## 2016-04-19 NOTE — ED Provider Notes (Addendum)
By signing my name below, I, Georgette Shell, attest that this documentation has been prepared under the direction and in the presence of [place], DO. Electronically Signed: Georgette Shell, ED Scribe. 04/19/16. 3:19 AM.  TIME SEEN: 3:12 AM  CHIEF COMPLAINT:  Chief Complaint  Patient presents with  . Weakness  . Fall   HPI:  HPI Comments: Cameron Moran is a 66 y.o. male with h/o asthma, CAD, DM, and HTN, who presents to the Emergency Department by EMS for evaluation following a mechanical fall just PTA. Pt reports he was sitting on the edge of the bed when he slid off and fell. No LOC or head injury. States he has been experiencing Generalized weakness and over the past several days. He reports normal appetite. Pt notes he has shortness of breath but this is baseline; he is on oxygen at home. He is ambulatory with a cane. Pt denies fever, chills, cough, chest pain, vomiting, diarrhea, hematochezia, melena, focal numbness/weakness, or any other associated symptoms. Denies any pain currently.  [name], wife - [phone]  PCP - Dr. Alyson Ingles  ROS: See HPI Constitutional: no fever  Eyes: no drainage  ENT: no runny nose   Cardiovascular:  no chest pain  Resp: SOB  GI: no vomiting GU: no dysuria Integumentary: no rash  Allergy: no hives  Musculoskeletal: no leg swelling  Neurological: no slurred speech ROS otherwise negative  PAST MEDICAL HISTORY/PAST SURGICAL HISTORY:  Past Medical History:  Diagnosis Date  . Asthma   . CAD (coronary artery disease)   . Depressive disorder, not elsewhere classified   . Diabetes mellitus   . Gout, unspecified   . Hyperlipidemia   . Hypertension   . Morbid obesity ([place])   . Sleep apnea   . Type II or unspecified type diabetes mellitus without mention of complication, uncontrolled     MEDICATIONS:  Prior to Admission medications   Medication Sig Start Date End Date Taking? Authorizing Provider  allopurinol (ZYLOPRIM) 300 MG tablet Take  300 mg by mouth daily.    Historical Provider, MD  citalopram (CELEXA) 20 MG tablet Take 20 mg by mouth daily.    Historical Provider, MD  hydrocodone-acetaminophen (LORCET-HD) 5-500 MG per capsule Take 2 capsules by mouth 2 (two) times daily.    Historical Provider, MD  indomethacin (INDOCIN) 50 MG capsule Take 50 mg by mouth 3 (three) times daily with meals.    Historical Provider, MD  levothyroxine (SYNTHROID, LEVOTHROID) 125 MCG tablet Take 250 mcg by mouth daily with breakfast.    Historical Provider, MD  lisinopril (PRINIVIL,ZESTRIL) 10 MG tablet Take 10 mg by mouth daily.    Historical Provider, MD  metFORMIN (GLUCOPHAGE) 1000 MG tablet Take 1,000 mg by mouth 2 (two) times daily with a meal.    Historical Provider, MD  sitaGLIPtin (JANUVIA) 100 MG tablet Take 100 mg by mouth daily.    Historical Provider, MD  triamcinolone cream (KENALOG) 0.1 % Apply 1 application topically 2 (two) times daily.    Historical Provider, MD    ALLERGIES:  Allergies  Allergen Reactions  . Doxycycline Calcium     Per [facility] chart note 05/31/11.    SOCIAL HISTORY:  Social History  Substance Use Topics  . Smoking status: Former Smoker    Types: Cigarettes    Quit date: 06/07/1994  . Smokeless tobacco: Not on file  . Alcohol use No     Comment: Quit drinking in 1996    FAMILY HISTORY: Family  History  Problem Relation Age of Onset  . Cancer Other     EXAM: BP 141/85   Pulse 86   Temp 97.6 F (36.4 C) (Oral)   Resp 16   Wt (!) 530 lb (240.4 kg)   SpO2 93%   BMI 78.27 kg/m  CONSTITUTIONAL: Alert and oriented and responds appropriately to questions. Chronically ill-appearing and morbidly obese. GCS 15 HEAD: Normocephalic; atraumatic EYES: Conjunctivae clear, PERRL, EOMI ENT: normal nose; no rhinorrhea; moist mucous membranes; pharynx without lesions noted; no dental injury; no septal hematoma NECK: Supple, no meningismus, no LAD; no midline spinal tenderness, step-off or deformity; trachea  midline CARD: RRR; S1 and S2 appreciated; no murmurs, no clicks, no rubs, no gallops RESP: Normal chest excursion without splinting or tachypnea; breath sounds clear and equal bilaterally; no wheezes, no rhonchi, no rales; no hypoxia or respiratory distress CHEST:  chest wall stable, no crepitus or ecchymosis or deformity, nontender to palpation; no flail chest ABD/GI: Normal bowel sounds; non-distended; soft, non-tender, no rebound, no guarding; no ecchymosis or other lesions noted PELVIS:  stable, nontender to palpation BACK:  The back appears normal and is non-tender to palpation, there is no CVA tenderness; no midline spinal tenderness, step-off or deformity EXT: Normal ROM in all joints; non-tender to palpation; no edema; normal capillary refill; no cyanosis, no bony tenderness or bony deformity of patient's extremities, no joint effusion, compartments are soft, extremities are warm and well-perfused, no ecchymosis or lacerations    SKIN: Normal color for age and race; warm, right lower extremity is mildly erythematous but not warm NEURO: Moves all extremities equally, sensation to light touch intact diffusely, cranial nerves II through XII intact PSYCH: The patient's mood and manner are appropriate. Grooming and personal hygiene are appropriate.  MEDICAL DECISION MAKING: Patient here generalized weakness. He has no recent infectious symptoms. No chest pain or worsening of his chronic shortness of breath. No focal neurologic deficits. Hemodynamically stable here. Reportedly patient was hypoxic on room air with EMS but he states that he wears oxygen chronically. He is in the upper 90s on his normal 2 L. Will obtain labs, urine, chest x-ray. He appears neurologically intact at this time. No complaints of pain. Denies any injury from the fall. States he did not hit his head or lose consciousness. States he just "slid out of bed".  ED PROGRESS: 6:20 AM  Patient's labs have been unremarkable. Chest  x-ray shows possible mild pulmonary edema. He denies any increase shortness of breath from his baseline. BNP is normal.  He does not feel volume overloaded. Does not appear clinically volume overloaded. I don't not feel he needs IV diuresis.   He has been unable to ambulate here stating that he has bilateral knee pain. States that this has been ongoing for years and he takes Vicodin at home. He agrees is likely due to his morbid obesity. Patient is 530 pounds. Denies any new injury to the legs. He thinks this is why he could not get himself up off the floor. He states he does not want admission to the hospital. I offered case management, social work and physical therapy evaluation in the morning given I am concerned about him about what he would do at home if he is not able to ambulate. He states that he feels like he would be fine at home, his wife is there to care for him. He reports he has a cane and a walker. We'll give him a dose of his Vicodin  here and then reassess. He is going to attempt to get in touch with his wife.  6:40 AM  Pt unable to get in touch with his wife. I have also left a message with his wife. Patient agrees to have case management, social work and physical therapy see him in the morning for help with outpatient needs.   6:50 AM  D/w pt's wife who states that she agrees that this is due to his weight and his knees.  She is comfortable with plan to for patient to go home. She agrees and would like he's management, social work and physical therapy consultation today. Again patient refuses admission at this time but I do not see any acute finding that we would be able to admit the patient for currently. He reports his shortness of breath is chronic and unchanged. He has no new focal neurologic deficits. Has bilateral knee pain that is chronic and unchanged. No new injury today.  Plan will be to sign out to oncoming EDP to follow up on PT, CM, SW recommendations.  I reviewed all nursing  notes, vitals, pertinent old records, EKGs, labs, imaging (as available).   EKG Interpretation  Date/Time:  Wednesday April 19 2016 03:04:40 EST Ventricular Rate:  87 PR Interval:    QRS Duration: 128 QT Interval:  403 QTC Calculation: 485 R Axis:   -61 Text Interpretation:  Sinus rhythm Nonspecific IVCD with LAD Anterior infarct, old Nonspecific T abnormalities, lateral leads No old tracing to compare Confirmed by WARD,  DO, KRISTEN ([id number]) on 04/19/2016 3:09:05 AM       I personally performed the services described in this documentation, which was scribed in my presence. The recorded information has been reviewed and is accurate.     [place], DO 04/19/16 [place], DO 04/19/16 [phone]

## 2016-04-19 NOTE — Care Management Note (Signed)
Case Management Note  Patient Details  Name: Cameron Moran MRN: [id number] Date of Birth: [birth year]  Subjective/Objective:                  From home with spouse. /66 y.o. male with h/o asthma, CAD, DM, and HTN, who presents to the Emergency Department by EMS for evaluation following a mechanical fall just PTA.  Action/Plan: Follow for disposition needs. /PT evaluation to help determine level of care needed.  Expected Discharge Date:  04/19/16               Expected Discharge Plan:  [place] (vs SNF)  In-House Referral:  Clinical Social Work  Discharge planning Services  CM Consult  Post Acute Care Choice:    Choice offered to:     DME Arranged:    DME Agency:     HH Arranged:    HH Agency:     Status of Service:  In process, will continue to follow  If discussed at Long Length of Stay Meetings, dates discussed:    Additional Comments: EDCM and EDSW met with pt at bedside to discuss discharge disposition.  Pt states he would like to wait for PT evaluation before making a choice.  EDCM and EDSW will follow for discharge disposition recommendations by PT.  Pt requests that we send in his RN and NA to help him go to restroom and sit up.  [facility] notified RN. Fuller Mandril, RN 04/19/2016, 9:23 AM

## 2016-04-20 NOTE — Discharge Planning (Signed)
EDSW received phone call from pt wife, Robley Dutton, who reports that pt is no longer willing to go to SNF. CSW and [facility] discussed concerns with safety if pt were to fall again of which pt says he does not plan on falling again.   EDCM and EDSW spoke with pt at bedside regarding discharge planning for [place]. Offered pt list of home health agencies to choose from.  Pt chose [facility] to render services. Dian Situ of [facility] notified. Patient made aware that [facility] will be in contact in 24-48 hours.  No DME needs identified at this time. Pt wife will transport home.

## 2016-04-20 NOTE — Progress Notes (Signed)
CSW received phone call from Patient's wife, Rendell Rabb, who reports that Patient is no longer willing to go to SNF. CSW and RN Case Manager discussed concerns with safety if Patient were to fall again. Patient scheduled to go [facility] after the facility receives the bariatric bed for him.   CSW, RN Tourist information centre manager, and Patient's wife (via T/C) discussed at length with patient who is adamant that he is not going to a skilled nursing facility at this time. Patient reports that he has been up walking around the hospital although this has not been seen by any of the medical staff. Patient and wife requesting home health services. Patient's wife reports that she is on her way to pick Patient up. RN to assist with home health services.  CSW signing off. Please contact should new need(s) arise.    Lorrine Kin, MSW, LCSW [facility] ED/55M Clinical Social Worker [phone]

## 2016-04-20 NOTE — ED Provider Notes (Signed)
Patient was seen overnight of February 28 after fall. Patient was awaiting in the emergency department for placement however this morning patient decided not to be placed and preferred to be discharged home with home health. No acute complaints. Patient's wife arrived to assist patient home.  Disposition: Discharge  Condition: Good  I have discussed the results, Dx and Tx plan with the patient who expressed understanding and agree(s) with the plan. Discharge instructions discussed at great length. The patient was given strict return precautions who verbalized understanding of the instructions. No further questions at time of discharge.    New Prescriptions   No medications on file    Follow Up: Maury Dus, MD [place] [address] [phone]  Schedule an appointment as soon as possible for a visit        Fatima Blank, MD 04/20/16 1059

## 2018-08-13 ENCOUNTER — Encounter (HOSPITAL_COMMUNITY): Payer: Self-pay | Admitting: Internal Medicine

## 2018-08-13 ENCOUNTER — Other Ambulatory Visit: Payer: Self-pay

## 2018-08-13 ENCOUNTER — Emergency Department (HOSPITAL_COMMUNITY): Payer: Medicare Other

## 2018-08-13 ENCOUNTER — Inpatient Hospital Stay (HOSPITAL_COMMUNITY)
Admission: EM | Admit: 2018-08-13 | Discharge: 2018-08-15 | DRG: 682 | Disposition: A | Discharge status: Home Health | Payer: Medicare Other | Attending: Internal Medicine | Admitting: Internal Medicine

## 2018-08-13 DIAGNOSIS — Z87891 Personal history of nicotine dependence: Secondary | ICD-10-CM

## 2018-08-13 DIAGNOSIS — G4730 Sleep apnea, unspecified: Secondary | ICD-10-CM | POA: Diagnosis present

## 2018-08-13 DIAGNOSIS — E119 Type 2 diabetes mellitus without complications: Secondary | ICD-10-CM | POA: Diagnosis present

## 2018-08-13 DIAGNOSIS — E785 Hyperlipidemia, unspecified: Secondary | ICD-10-CM | POA: Diagnosis present

## 2018-08-13 DIAGNOSIS — Z79899 Other long term (current) drug therapy: Secondary | ICD-10-CM

## 2018-08-13 DIAGNOSIS — Z7984 Long term (current) use of oral hypoglycemic drugs: Secondary | ICD-10-CM

## 2018-08-13 DIAGNOSIS — L03116 Cellulitis of left lower limb: Secondary | ICD-10-CM | POA: Diagnosis present

## 2018-08-13 DIAGNOSIS — N179 Acute kidney failure, unspecified: Principal | ICD-10-CM | POA: Diagnosis present

## 2018-08-13 DIAGNOSIS — R571 Hypovolemic shock: Secondary | ICD-10-CM | POA: Diagnosis present

## 2018-08-13 DIAGNOSIS — J45909 Unspecified asthma, uncomplicated: Secondary | ICD-10-CM | POA: Diagnosis present

## 2018-08-13 DIAGNOSIS — Z79890 Hormone replacement therapy (postmenopausal): Secondary | ICD-10-CM

## 2018-08-13 DIAGNOSIS — Z79891 Long term (current) use of opiate analgesic: Secondary | ICD-10-CM

## 2018-08-13 DIAGNOSIS — R531 Weakness: Secondary | ICD-10-CM | POA: Diagnosis present

## 2018-08-13 DIAGNOSIS — I2510 Atherosclerotic heart disease of native coronary artery without angina pectoris: Secondary | ICD-10-CM | POA: Diagnosis present

## 2018-08-13 DIAGNOSIS — I10 Essential (primary) hypertension: Secondary | ICD-10-CM | POA: Diagnosis present

## 2018-08-13 DIAGNOSIS — M109 Gout, unspecified: Secondary | ICD-10-CM | POA: Diagnosis present

## 2018-08-13 DIAGNOSIS — Z1159 Encounter for screening for other viral diseases: Secondary | ICD-10-CM

## 2018-08-13 DIAGNOSIS — R012 Other cardiac sounds: Secondary | ICD-10-CM | POA: Diagnosis not present

## 2018-08-13 DIAGNOSIS — F329 Major depressive disorder, single episode, unspecified: Secondary | ICD-10-CM | POA: Diagnosis present

## 2018-08-13 LAB — POCT I-STAT EG7
Acid-base deficit: 6 mmol/L — ABNORMAL HIGH (ref 0.0–2.0)
Bicarbonate: 21 mmol/L (ref 20.0–28.0)
Calcium, Ion: 1.03 mmol/L — ABNORMAL LOW (ref 1.15–1.40)
HCT: 36 % — ABNORMAL LOW (ref 39.0–52.0)
Hemoglobin: 12.2 g/dL — ABNORMAL LOW (ref 13.0–17.0)
O2 Saturation: 69 %
Potassium: 5.1 mmol/L (ref 3.5–5.1)
Sodium: 134 mmol/L — ABNORMAL LOW (ref 135–145)
TCO2: 22 mmol/L (ref 22–32)
pCO2, Ven: 47.2 mmHg (ref 44.0–60.0)
pH, Ven: 7.257 (ref 7.250–7.430)
pO2, Ven: 42 mmHg (ref 32.0–45.0)

## 2018-08-13 LAB — CBC WITH DIFFERENTIAL/PLATELET
Abs Immature Granulocytes: 0.03 10*3/uL (ref 0.00–0.07)
Basophils Absolute: 0.1 10*3/uL (ref 0.0–0.1)
Basophils Relative: 1 %
Eosinophils Absolute: 0.4 10*3/uL (ref 0.0–0.5)
Eosinophils Relative: 5 %
HCT: 36.4 % — ABNORMAL LOW (ref 39.0–52.0)
Hemoglobin: 11.4 g/dL — ABNORMAL LOW (ref 13.0–17.0)
Immature Granulocytes: 0 %
Lymphocytes Relative: 17 %
Lymphs Abs: 1.4 10*3/uL (ref 0.7–4.0)
MCH: 31.8 pg (ref 26.0–34.0)
MCHC: 31.3 g/dL (ref 30.0–36.0)
MCV: 101.4 fL — ABNORMAL HIGH (ref 80.0–100.0)
Monocytes Absolute: 0.8 10*3/uL (ref 0.1–1.0)
Monocytes Relative: 9 %
Neutro Abs: 5.8 10*3/uL (ref 1.7–7.7)
Neutrophils Relative %: 68 %
Platelets: 255 10*3/uL (ref 150–400)
RBC: 3.59 MIL/uL — ABNORMAL LOW (ref 4.22–5.81)
RDW: 14.3 % (ref 11.5–15.5)
WBC: 8.5 10*3/uL (ref 4.0–10.5)
nRBC: 0 % (ref 0.0–0.2)

## 2018-08-13 LAB — URINALYSIS, ROUTINE W REFLEX MICROSCOPIC
Bacteria, UA: NONE SEEN
Bilirubin Urine: NEGATIVE
Glucose, UA: NEGATIVE mg/dL
Ketones, ur: NEGATIVE mg/dL
Nitrite: NEGATIVE
Protein, ur: NEGATIVE mg/dL
Specific Gravity, Urine: 1.012 (ref 1.005–1.030)
pH: 5 (ref 5.0–8.0)

## 2018-08-13 LAB — COMPREHENSIVE METABOLIC PANEL
ALT: 19 U/L (ref 0–44)
AST: 23 U/L (ref 15–41)
Albumin: 3.2 g/dL — ABNORMAL LOW (ref 3.5–5.0)
Alkaline Phosphatase: 45 U/L (ref 38–126)
Anion gap: 16 — ABNORMAL HIGH (ref 5–15)
BUN: 46 mg/dL — ABNORMAL HIGH (ref 8–23)
CO2: 18 mmol/L — ABNORMAL LOW (ref 22–32)
Calcium: 8.3 mg/dL — ABNORMAL LOW (ref 8.9–10.3)
Chloride: 100 mmol/L (ref 98–111)
Creatinine, Ser: 5.97 mg/dL — ABNORMAL HIGH (ref 0.61–1.24)
GFR calc Af Amer: 10 mL/min — ABNORMAL LOW (ref >60)
GFR calc non Af Amer: 9 mL/min — ABNORMAL LOW (ref >60)
Glucose, Bld: 174 mg/dL — ABNORMAL HIGH (ref 70–99)
Potassium: 5.1 mmol/L (ref 3.5–5.1)
Sodium: 134 mmol/L — ABNORMAL LOW (ref 135–145)
Total Bilirubin: 0.9 mg/dL (ref 0.3–1.2)
Total Protein: 7 g/dL (ref 6.5–8.1)

## 2018-08-13 LAB — TSH: TSH: 2.621 u[IU]/mL (ref 0.350–4.500)

## 2018-08-13 LAB — SARS CORONAVIRUS 2 BY RT PCR (HOSPITAL ORDER, PERFORMED IN ~~LOC~~ HOSPITAL LAB): SARS Coronavirus 2: NEGATIVE

## 2018-08-13 LAB — CK: Total CK: 243 U/L (ref 49–397)

## 2018-08-13 LAB — TROPONIN I (HIGH SENSITIVITY)
Troponin I (High Sensitivity): 17 ng/L (ref <18)
Troponin I (High Sensitivity): 17 ng/L (ref <18)

## 2018-08-13 LAB — LACTIC ACID, PLASMA
Lactic Acid, Venous: 1.9 mmol/L (ref 0.5–1.9)
Lactic Acid, Venous: 1.4 mmol/L (ref 0.5–1.9)

## 2018-08-13 LAB — BRAIN NATRIURETIC PEPTIDE: B Natriuretic Peptide: 140.3 pg/mL — ABNORMAL HIGH (ref 0.0–100.0)

## 2018-08-13 LAB — ACETAMINOPHEN LEVEL: Acetaminophen (Tylenol), Serum: <10 ug/mL — ABNORMAL LOW (ref 10–30)

## 2018-08-13 MED ORDER — SODIUM CHLORIDE 0.9 % IV BOLUS
500.0000 mL | Freq: Once | INTRAVENOUS | Status: AC
  Administered 2018-08-13: 500 mL via INTRAVENOUS

## 2018-08-13 MED ORDER — SODIUM CHLORIDE 0.9 % IV BOLUS
1000.0000 mL | Freq: Once | INTRAVENOUS | Status: AC
  Administered 2018-08-13: 15:00:00 1000 mL via INTRAVENOUS

## 2018-08-13 MED ORDER — SODIUM CHLORIDE 0.9 % IV SOLN
INTRAVENOUS | Status: DC
  Administered 2018-08-14 (×3): via INTRAVENOUS

## 2018-08-13 MED ORDER — SODIUM CHLORIDE 0.9 % IV BOLUS
2000.0000 mL | Freq: Once | INTRAVENOUS | Status: AC
  Administered 2018-08-13: 2000 mL via INTRAVENOUS

## 2018-08-13 NOTE — ED Provider Notes (Signed)
Care taken over from Dr. Tyrone Nine.  Patient presents with hypotension and weakness.  He does not have any fever, elevated white count or elevated lactate to suggest sepsis/infectious etiology.  He does have acute renal failure.  There was concern that he has not been drinking for the last 24 hours which may be the etiology for his symptoms.  His blood pressure is improving with IV fluids.  He is currently asymptomatic and actually has tried to leave the emergency department a couple of times but at this point is amenable to admission.  I spoke with Dr. Olevia Bowens with hospitalist service to admit the patient for further treatment.   Malvin Johns, MD 08/13/18 2024

## 2018-08-13 NOTE — H&P (Signed)
History and Physical    Cameron Moran WCH:[id number] DOB: [birth year] DOA: 08/13/2018  PCP: Maury Dus, MD   Patient coming from: Home.  I have personally briefly reviewed patient's old medical records in [place]  Chief Complaint: Weakness.  HPI: Cameron Moran is a 68 y.o. male with medical history significant of asthma, CAD, depression, gout, hyperlipidemia, hypertension, morbid obesity, sleep apnea on home oxygen, type 2 diabetes who is coming to the emergency department due to progressively worse weakness and nervousness since yesterday.  Dr. Tyrone Nine and Dr. Tamera Punt spoke to some of the patient's relatives, which had concerns for the patient overusing his hydrocodone.  They were concerned that he had not had anything to drink in at least 24 hours, but did not add anything further.  The patient stated basically that he was feeling weak and lightheaded, but did not add anything else to the history.  He denies fever, chills, sore throat, productive cough, chest pain, palpitations, diaphoresis, PND or orthopnea.  No abdominal pain, nausea or emesis, diarrhea or constipation, melena or hematochezia.  He denies dysuria, frequency or hematuria, but states he has urinated less than usual in the past 2 days.  No polyuria, polydipsia, polyphagia or blurred vision.  ED Course: Initial vital signs temperature 98.5 F, pulse 92, respirations 16, blood pressure 80/31 mmHg and O2 sat 99% on room air.  The patient received 1500 mL of normal saline bolus in the emergency department.  I added an extra bolus of 2000 mL of normal saline.  pH, PCO2, PO2, bicarbonate and O2 sat were within normal limits on i-STAT EG 7.  CBC shows a white count of 8.5, hemoglobin 11.4 g/dL and platelets 255.  TSH was normal.  Troponin was normal.  BNP was 140.3 pg/mL.  Total CK was 243 units/L.  Unremarkable acetaminophen level.  Lactic acid x2 was normal.  SARS coronavirus 2 was negative.  Sodium is 134, potassium 5.1,  chloride 100 and CO2 18 mmol/L.  His anion gap was 16.  Calcium 8.3, glucose 174, BUN 46 and creatinine 5.97 mg/dL.  His albumin was 3.2 g/dL.  The rest of the LFTs were within normal limits.  Review of Systems: As per HPI otherwise 10 point review of systems negative.   Past Medical History:  Diagnosis Date  . Asthma   . CAD (coronary artery disease)   . Depressive disorder, not elsewhere classified   . Diabetes mellitus   . Gout, unspecified   . Hyperlipidemia   . Hypertension   . Morbid obesity ([place])   . Sleep apnea   . Type II or unspecified type diabetes mellitus without mention of complication, uncontrolled     Past Surgical History:  Procedure Laterality Date  . KNEE SURGERY  1989-90  . SLEEVE GASTROPLASTY  2007     reports that he quit smoking about 24 years ago. His smoking use included cigarettes. He does not have any smokeless tobacco history on file. He reports that he does not drink alcohol. No history on file for drug.  Allergies  Allergen Reactions  . Doxycycline Calcium Other (See Comments)    Per [facility] chart note 05/31/11.    Family History  Problem Relation Age of Onset  . Cancer Other    Prior to Admission medications   Medication Sig Start Date End Date Taking? Authorizing Provider  allopurinol (ZYLOPRIM) 300 MG tablet Take 300 mg by mouth daily.   Yes [provider]  ALPRAZolam Duanne Moron) 0.25 MG tablet  Take 0.5 mg by mouth 2 (two) times daily as needed for anxiety.    Yes [provider]  buPROPion (WELLBUTRIN XL) 150 MG 24 hr tablet Take 150 mg by mouth daily.  03/05/16  Yes [provider]  citalopram (CELEXA) 20 MG tablet Take 20 mg by mouth daily.   Yes [provider]  gabapentin (NEURONTIN) 300 MG capsule Take 300 mg by mouth See admin instructions. Take 300 mg  In the morning and the afternoon 900 mg in the evening   Yes [provider]  HYDROcodone-acetaminophen (NORCO) 10-325 MG tablet Take 1 tablet by  mouth every 6 (six) hours as needed.   Yes [provider]  Levothyroxine Sodium 112 MCG CAPS Take 224 mcg by mouth daily with breakfast.    Yes [provider]  lisinopril (PRINIVIL,ZESTRIL) 10 MG tablet Take 10 mg by mouth daily.   Yes [provider]  metFORMIN (GLUCOPHAGE) 1000 MG tablet Take 1,000 mg by mouth 2 (two) times daily with a meal.   Yes [provider]  triamcinolone cream (KENALOG) 0.1 % Apply 1 application topically 2 (two) times daily.    [provider]    Physical Exam: Vitals:   08/13/18 2015 08/13/18 2030 08/13/18 2045 08/13/18 2100  BP: 106/62 90/69 98/86  94/77  Pulse: 92 92 91 90  Resp: (!) 23 18 (!) 22 18  Temp:      TempSrc:      SpO2: 92% 91% 92% 99%    Constitutional: NAD, calm, comfortable Eyes: PERRL, lids and conjunctivae normal ENMT: Mucous membranes are dry. Posterior pharynx clear of any exudate or lesions. Neck: normal, supple, no masses, no thyromegaly Respiratory: Decreased breath sounds on bases, otherwise clear to auscultation bilaterally, no wheezing, no crackles. Normal respiratory effort. No accessory muscle use.  Cardiovascular: Regular rate and rhythm, 2/6 systolic murmur, no rubs / gallops. Positive stage III lymphedema. 2+ pedal pulses. No carotid bruits.  Abdomen: Obese, soft, no tenderness, no masses palpated. No hepatosplenomegaly. Bowel sounds positive.  Musculoskeletal: no clubbing / cyanosis. Good ROM, no contractures. Normal muscle tone.  Skin: Bilateral lower extremities chronic dermatitis with recent healing aberrations. Neurologic: CN 2-12 grossly intact. Sensation intact, DTR normal. Strength 5/5 in all 4.  Psychiatric: Normal judgment and insight. Alert and oriented x 3. Normal mood.   Labs on Admission: I have personally reviewed following labs and imaging studies  CBC: Recent Labs  Lab 08/13/18 1303 08/13/18 1327  WBC 8.5  --   NEUTROABS 5.8  --   HGB 11.4* 12.2*  HCT  36.4* 36.0*  MCV 101.4*  --   PLT 255  --    Basic Metabolic Panel: Recent Labs  Lab 08/13/18 1303 08/13/18 1327  NA 134* 134*  K 5.1 5.1  CL 100  --   CO2 18*  --   GLUCOSE 174*  --   BUN 46*  --   CREATININE 5.97*  --   CALCIUM 8.3*  --    GFR: CrCl cannot be calculated (Unknown ideal weight.). Liver Function Tests: Recent Labs  Lab 08/13/18 1303  AST 23  ALT 19  ALKPHOS 45  BILITOT 0.9  PROT 7.0  ALBUMIN 3.2*   No results for input(s): LIPASE, AMYLASE in the last 168 hours. No results for input(s): AMMONIA in the last 168 hours. Coagulation Profile: No results for input(s): INR, PROTIME in the last 168 hours. Cardiac Enzymes: Recent Labs  Lab 08/13/18 1911  CKTOTAL 243   BNP (  last 3 results) No results for input(s): PROBNP in the last 8760 hours. HbA1C: No results for input(s): HGBA1C in the last 72 hours. CBG: No results for input(s): GLUCAP in the last 168 hours. Lipid Profile: No results for input(s): CHOL, HDL, LDLCALC, TRIG, CHOLHDL, LDLDIRECT in the last 72 hours. Thyroid Function Tests: Recent Labs    08/13/18 1306  TSH 2.621   Anemia Panel: No results for input(s): VITAMINB12, FOLATE, FERRITIN, TIBC, IRON, RETICCTPCT in the last 72 hours. Urine analysis:    Component Value Date/Time   COLORURINE YELLOW 04/19/2016 [place] 04/19/2016 0353   LABSPEC 1.008 04/19/2016 0353   PHURINE 7.0 04/19/2016 0353   GLUCOSEU NEGATIVE 04/19/2016 0353   HGBUR NEGATIVE 04/19/2016 0353   BILIRUBINUR NEGATIVE 04/19/2016 0353   KETONESUR NEGATIVE 04/19/2016 0353   PROTEINUR NEGATIVE 04/19/2016 0353   UROBILINOGEN 0.2 05/09/2010 2036   NITRITE NEGATIVE 04/19/2016 0353   LEUKOCYTESUR NEGATIVE 04/19/2016 0353    Radiological Exams on Admission: Dg Chest Port 1 View  Result Date: 08/13/2018 CLINICAL DATA:  Shortness of breath. EXAM: PORTABLE CHEST 1 VIEW COMPARISON:  Chest x-ray dated April 19, 2016. FINDINGS: Stable cardiomediastinal  silhouette with borderline cardiomegaly. Normal pulmonary vascularity. Left basilar opacity. No pleural effusion or pneumothorax. Unchanged elevation of the right hemidiaphragm. No acute osseous abnormality. IMPRESSION: 1. Left basilar atelectasis versus infiltrate. Electronically Signed   By: Titus Dubin M.D.   On: 08/13/2018 13:23    EKG: Independently reviewed. Vent. rate 86 BPM PR interval * ms QRS duration 136 ms QT/QTc 427/511 ms P-R-T axes 40 -57 76 Sinus rhythm Nonspecific IVCD with LAD Consider anterior infarct  Assessment/Plan Principal Problem:   AKI (acute kidney injury) ([place]) Admit to progressive unit/inpatient. Hold lisinopril. Continue IV hydration. Monitor intake and output. Check urinalysis. Check ultrasound of kidneys. Follow-up renal function electrolytes. Consult nephrology if no improvement.  Active Problems:   Essential hypertension Hold lisinopril due to AKI and hypotension. Monitor blood pressure, renal function and electrolytes.    Heart murmur Check echocardiogram. This should be also helpful to manage I/O.    Sleep apnea Not on CPAP.    Hyperlipidemia Not on medical therapy. Will defer starting statin while on AKI. Should discuss long-term goal with his PCP.    CAD (coronary artery disease) Start aspirin 162 mg p.o. daily. Should follow-up with cardiology.    Type 2 diabetes mellitus (H[facility]) Carbohydrate modified diet. Hold metformin due to AKI. CBG monitoring with regular insulin sliding scale.    DVT prophylaxis: Lovenox SQ. Code Status: Full code. Family Communication:  Disposition Plan: Admit for IV hydration and further work-up. Consults called: Admission status: Inpatient/progressive unit.   Reubin Milan MD [facility]  08/13/2018, 10:52 PM   This document was prepared using Dragon voice recognition software and may contain some unintended transcription errors.

## 2018-08-13 NOTE — ED Notes (Signed)
Pt is requesting to leave. Dr Tyrone Nine notified

## 2018-08-13 NOTE — ED Triage Notes (Signed)
Pt reports weakness and nervousness since Monday. Pt is on O2 at home.

## 2018-08-13 NOTE — ED Provider Notes (Signed)
[place] EMERGENCY DEPARTMENT Provider Note   CSN: [id number] Arrival date & time: 08/13/18  1242    History   Chief Complaint Chief Complaint  Patient presents with  . Weakness    HPI Cameron Moran is a 68 y.o. male.     68 yo M with a chief complaint of generalized weakness.  Going on since last night.  States he has been eating and drinking normally.  He denies cough congestion or fever.  Denies vomiting or diarrhea.  Denies chest pain or pressure.  Feels that his legs are maybe mildly more swollen than normal.  Has had some shortness of breath for the past day or so as well.  The history is provided by the patient.  Weakness Severity:  Severe Onset quality:  Sudden Duration:  1 day Timing:  Constant Progression:  Worsening Chronicity:  New Relieved by:  Nothing Worsened by:  Nothing Ineffective treatments:  None tried Associated symptoms: shortness of breath   Associated symptoms: no abdominal pain, no arthralgias, no chest pain, no diarrhea, no fever, no headaches, no myalgias and no vomiting     Past Medical History:  Diagnosis Date  . Asthma   . CAD (coronary artery disease)   . Depressive disorder, not elsewhere classified   . Diabetes mellitus   . Gout, unspecified   . Hyperlipidemia   . Hypertension   . Morbid obesity ([place])   . Sleep apnea   . Type II or unspecified type diabetes mellitus without mention of complication, uncontrolled     Patient Active Problem List   Diagnosis Date Noted  . COLONIC POLYPS, HYPERPLASTIC 04/15/2007  . DIABETES MELLITUS, TYPE II 04/15/2007  . MORBID OBESITY 04/15/2007  . ANXIETY 04/15/2007  . DEPRESSION 04/15/2007  . HYPERTENSION 04/15/2007  . CORONARY ARTERY DISEASE 04/15/2007  . ASTHMA 04/15/2007  . SLEEP APNEA 04/15/2007    Past Surgical History:  Procedure Laterality Date  . KNEE SURGERY  1989-90  . SLEEVE GASTROPLASTY  2007        Home Medications    Prior to Admission  medications   Medication Sig Start Date End Date Taking? Authorizing Provider  allopurinol (ZYLOPRIM) 300 MG tablet Take 300 mg by mouth daily.   Yes [provider]  ALPRAZolam (XANAX) 0.25 MG tablet Take 0.25 mg by mouth 2 (two) times daily as needed for anxiety.    [provider]  buPROPion (WELLBUTRIN XL) 150 MG 24 hr tablet Take 150 mg by mouth daily.  03/05/16   [provider]  citalopram (CELEXA) 20 MG tablet Take 20 mg by mouth daily.    [provider]  hydrocodone-acetaminophen (LORCET-HD) 5-500 MG per capsule Take 2 capsules by mouth 2 (two) times daily.    [provider]  HYDROcodone-acetaminophen (NORCO) 10-325 MG tablet Take 1 tablet by mouth every 6 (six) hours as needed.    [provider]  indomethacin (INDOCIN) 50 MG capsule Take 50 mg by mouth 3 (three) times daily with meals.    [provider]  levothyroxine (SYNTHROID, LEVOTHROID) 125 MCG tablet Take 250 mcg by mouth daily with breakfast.    [provider]  lisinopril (PRINIVIL,ZESTRIL) 10 MG tablet Take 10 mg by mouth daily.    [provider]  metFORMIN (GLUCOPHAGE) 1000 MG tablet Take 1,000 mg by mouth 2 (two) times daily with a meal.    [provider]  sitaGLIPtin (JANUVIA) 100 MG tablet Take 100 mg by mouth daily.  [provider]  triamcinolone cream (KENALOG) 0.1 % Apply 1 application topically 2 (two) times daily.    [provider]    Family History Family History  Problem Relation Age of Onset  . Cancer Other     Social History Social History   Tobacco Use  . Smoking status: Former Smoker    Types: Cigarettes    Quit date: 06/07/1994    Years since quitting: 24.2  Substance Use Topics  . Alcohol use: No    Comment: Quit drinking in 1996  . Drug use: Not on file     Allergies   Doxycycline calcium   Review of Systems Review of Systems  Constitutional: Negative for chills and fever.   HENT: Negative for congestion and facial swelling.   Eyes: Negative for discharge and visual disturbance.  Respiratory: Positive for shortness of breath.   Cardiovascular: Negative for chest pain and palpitations.  Gastrointestinal: Negative for abdominal pain, diarrhea and vomiting.  Musculoskeletal: Negative for arthralgias and myalgias.  Skin: Negative for color change and rash.  Neurological: Positive for weakness. Negative for tremors, syncope and headaches.  Psychiatric/Behavioral: Negative for confusion and dysphoric mood.     Physical Exam Updated Vital Signs BP (!) 81/59   Pulse 87   Temp 98.5 F (36.9 C) (Oral)   Resp (!) 24   SpO2 92%   Physical Exam Vitals signs and nursing note reviewed.  Constitutional:      Appearance: He is well-developed.     Comments: Morbidly obese  HENT:     Head: Normocephalic and atraumatic.  Eyes:     Pupils: Pupils are equal, round, and reactive to light.  Neck:     Musculoskeletal: Normal range of motion and neck supple.     Vascular: No JVD.  Cardiovascular:     Rate and Rhythm: Normal rate and regular rhythm.     Heart sounds: No murmur. No friction rub. No gallop.   Pulmonary:     Effort: No respiratory distress.     Breath sounds: No wheezing.  Abdominal:     General: There is no distension.     Tenderness: There is no guarding or rebound.  Musculoskeletal: Normal range of motion.     Right lower leg: Edema present.     Left lower leg: Edema present.     Comments: 2+ edema to the bilateral lower extremities up to the thighs.  Skin:    Coloration: Skin is not pale.     Findings: No rash.  Neurological:     Mental Status: He is alert and oriented to person, place, and time.  Psychiatric:        Behavior: Behavior normal.      ED Treatments / Results  Labs (all labs ordered are listed, but only abnormal results are displayed) Labs Reviewed  CBC WITH DIFFERENTIAL/PLATELET - Abnormal; Notable for the following  components:      Result Value   RBC 3.59 (*)    Hemoglobin 11.4 (*)    HCT 36.4 (*)    MCV 101.4 (*)    All other components within normal limits  COMPREHENSIVE METABOLIC PANEL - Abnormal; Notable for the following components:   Sodium 134 (*)    CO2 18 (*)    Glucose, Bld 174 (*)    BUN 46 (*)    Creatinine, Ser 5.97 (*)    Calcium 8.3 (*)    Albumin 3.2 (*)    GFR calc non Af Wyvonnia Lora  9 (*)    GFR calc Af Amer 10 (*)    Anion gap 16 (*)    All other components within normal limits  BRAIN NATRIURETIC PEPTIDE - Abnormal; Notable for the following components:   B Natriuretic Peptide 140.3 (*)    All other components within normal limits  POCT I-STAT EG7 - Abnormal; Notable for the following components:   Acid-base deficit 6.0 (*)    Sodium 134 (*)    Calcium, Ion 1.03 (*)    HCT 36.0 (*)    Hemoglobin 12.2 (*)    All other components within normal limits  SARS CORONAVIRUS 2 (HOSPITAL ORDER, [facility])  TROPONIN I (HIGH SENSITIVITY)  TROPONIN I (HIGH SENSITIVITY)  TSH  LACTIC ACID, PLASMA  LACTIC ACID, PLASMA    EKG EKG Interpretation  Date/Time:  Tuesday August 13 2018 14:56:38 EDT Ventricular Rate:  86 PR Interval:    QRS Duration: 136 QT Interval:  427 QTC Calculation: 511 R Axis:   -57 Text Interpretation:  Sinus rhythm Nonspecific IVCD with LAD Consider anterior infarct No significant change since last tracing Confirmed by Deno Etienne [phone]) on 08/13/2018 3:01:35 PM   Radiology Dg Chest Port 1 View  Result Date: 08/13/2018 CLINICAL DATA:  Shortness of breath. EXAM: PORTABLE CHEST 1 VIEW COMPARISON:  Chest x-ray dated April 19, 2016. FINDINGS: Stable cardiomediastinal silhouette with borderline cardiomegaly. Normal pulmonary vascularity. Left basilar opacity. No pleural effusion or pneumothorax. Unchanged elevation of the right hemidiaphragm. No acute osseous abnormality. IMPRESSION: 1. Left basilar atelectasis versus infiltrate.  Electronically Signed   By: Titus Dubin M.D.   On: 08/13/2018 13:23    Procedures Procedures (including critical care time)  Medications Ordered in ED Medications  sodium chloride 0.9 % bolus 1,000 mL (1,000 mLs Intravenous New Bag/Given 08/13/18 1441)     Initial Impression / Assessment and Plan / ED Course  I have reviewed the triage vital signs and the nursing notes.  Pertinent labs & imaging results that were available during my care of the patient were reviewed by me and considered in my medical decision making (see chart for details).        68 yo M with a chief complaint of generalized fatigue and weakness.  Going on for the past day.  Patient is a very poor historian and does not provide much further history.  He denies any infectious complaints.  Chest x-ray with a possible left lower lobe pneumonia though patient without cough fever and a normal white count I will hold on antibiotics at this time.  He is hypertensive on arrival I wonder if this is due to his extreme morbid obesity and difficulty for accurate blood pressure measurement though the patient does appear to be significantly weak.  Lab work with acute kidney injury from a baseline creatinine of about 1-5.  At this point I suspect hypovolemia is the most likely cause.  Will give IV fluids and reassess.  Patient care was signed out to Dr. Tamera Punt, please see her note for further details care in the ED.  CRITICAL CARE Performed by: Cecilio Asper   Total critical care time: 35 minutes  Critical care time was exclusive of separately billable procedures and treating other patients.  Critical care was necessary to treat or prevent imminent or life-threatening deterioration.  Critical care was time spent personally by me on the following activities: development of treatment plan with patient and/or surrogate as well as nursing, discussions with consultants,  evaluation of patient's response to treatment,  examination of patient, obtaining history from patient or surrogate, ordering and performing treatments and interventions, ordering and review of laboratory studies, ordering and review of radiographic studies, pulse oximetry and re-evaluation of patient's condition.  The patients results and plan were reviewed and discussed.   Any x-rays performed were independently reviewed by myself.   Differential diagnosis were considered with the presenting HPI.  Medications  sodium chloride 0.9 % bolus 1,000 mL (1,000 mLs Intravenous New Bag/Given 08/13/18 1441)    Vitals:   08/13/18 1248 08/13/18 1303 08/13/18 1306  BP: (!) 80/31 (!) 81/59   Pulse: 92  87  Resp: 16  (!) 24  Temp:  98.5 F (36.9 C)   TempSrc:  Oral   SpO2: 99%  92%    Final diagnoses:  AKI (acute kidney injury) ([place])  Hypovolemic shock (HCC)     Final Clinical Impressions(s) / ED Diagnoses   Final diagnoses:  AKI (acute kidney injury) ([place])  Hypovolemic shock [facility])    ED Discharge Orders    None       Deno Etienne, DO 08/13/18 1551

## 2018-08-13 NOTE — ED Notes (Signed)
Pt body habitus, movement and lack of cooperation required repeated attempts at EKG. New EKG resulted and given to EDP.

## 2018-08-13 NOTE — ED Notes (Signed)
Pt placed on 3lpm O2

## 2018-08-13 NOTE — ED Notes (Signed)
Assisted pt with calling his wife to update. [phone]

## 2018-08-13 NOTE — ED Notes (Signed)
IV team at bedside 

## 2018-08-14 ENCOUNTER — Inpatient Hospital Stay (HOSPITAL_COMMUNITY): Payer: Medicare Other

## 2018-08-14 DIAGNOSIS — R012 Other cardiac sounds: Secondary | ICD-10-CM

## 2018-08-14 LAB — GLUCOSE, CAPILLARY
Glucose-Capillary: 142 mg/dL — ABNORMAL HIGH (ref 70–99)
Glucose-Capillary: 164 mg/dL — ABNORMAL HIGH (ref 70–99)
Glucose-Capillary: 149 mg/dL — ABNORMAL HIGH (ref 70–99)
Glucose-Capillary: 157 mg/dL — ABNORMAL HIGH (ref 70–99)

## 2018-08-14 LAB — ECHOCARDIOGRAM COMPLETE

## 2018-08-14 LAB — CBG MONITORING, ED: Glucose-Capillary: 126 mg/dL — ABNORMAL HIGH (ref 70–99)

## 2018-08-14 LAB — BRAIN NATRIURETIC PEPTIDE: B Natriuretic Peptide: 459.5 pg/mL — ABNORMAL HIGH (ref 0.0–100.0)

## 2018-08-14 LAB — FERRITIN: Ferritin: 103 ng/mL (ref 24–336)

## 2018-08-14 LAB — IRON AND TIBC
Iron: 56 ug/dL (ref 45–182)
Saturation Ratios: 22 % (ref 17.9–39.5)
TIBC: 253 ug/dL (ref 250–450)
UIBC: 197 ug/dL

## 2018-08-14 LAB — RETICULOCYTES
Immature Retic Fract: 20.6 % — ABNORMAL HIGH (ref 2.3–15.9)
RBC.: 3.26 MIL/uL — ABNORMAL LOW (ref 4.22–5.81)
Retic Count, Absolute: 46 10*3/uL (ref 19.0–186.0)
Retic Ct Pct: 1.4 % (ref 0.4–3.1)

## 2018-08-14 LAB — FOLATE: Folate: 17.8 ng/mL (ref >5.9)

## 2018-08-14 LAB — COMPREHENSIVE METABOLIC PANEL
ALT: 15 U/L (ref 0–44)
AST: 23 U/L (ref 15–41)
Albumin: 2.8 g/dL — ABNORMAL LOW (ref 3.5–5.0)
Alkaline Phosphatase: 37 U/L — ABNORMAL LOW (ref 38–126)
Anion gap: 11 (ref 5–15)
BUN: 42 mg/dL — ABNORMAL HIGH (ref 8–23)
CO2: 19 mmol/L — ABNORMAL LOW (ref 22–32)
Calcium: 7.5 mg/dL — ABNORMAL LOW (ref 8.9–10.3)
Chloride: 105 mmol/L (ref 98–111)
Creatinine, Ser: 3.44 mg/dL — ABNORMAL HIGH (ref 0.61–1.24)
GFR calc Af Amer: 20 mL/min — ABNORMAL LOW (ref >60)
GFR calc non Af Amer: 17 mL/min — ABNORMAL LOW (ref >60)
Glucose, Bld: 143 mg/dL — ABNORMAL HIGH (ref 70–99)
Potassium: 4.5 mmol/L (ref 3.5–5.1)
Sodium: 135 mmol/L (ref 135–145)
Total Bilirubin: 1 mg/dL (ref 0.3–1.2)
Total Protein: 6.1 g/dL — ABNORMAL LOW (ref 6.5–8.1)

## 2018-08-14 LAB — VITAMIN B12: Vitamin B-12: 511 pg/mL (ref 180–914)

## 2018-08-14 MED ORDER — INSULIN ASPART 100 UNIT/ML ~~LOC~~ SOLN
0.0000 [IU] | Freq: Three times a day (TID) | SUBCUTANEOUS | Status: DC
  Administered 2018-08-14 (×2): 3 [IU] via SUBCUTANEOUS
  Administered 2018-08-14 – 2018-08-15 (×3): 4 [IU] via SUBCUTANEOUS

## 2018-08-14 MED ORDER — LEVOTHYROXINE SODIUM 112 MCG PO TABS
224.0000 ug | ORAL_TABLET | Freq: Every day | ORAL | Status: DC
  Administered 2018-08-14 – 2018-08-15 (×2): 224 ug via ORAL
  Filled 2018-08-14 (×2): qty 2

## 2018-08-14 MED ORDER — CITALOPRAM HYDROBROMIDE 10 MG PO TABS
20.0000 mg | ORAL_TABLET | Freq: Every day | ORAL | Status: DC
  Administered 2018-08-14 – 2018-08-15 (×2): 20 mg via ORAL
  Filled 2018-08-14 (×2): qty 2

## 2018-08-14 MED ORDER — GABAPENTIN 300 MG PO CAPS: 300.0000 mg | ORAL_CAPSULE | ORAL | Status: DC

## 2018-08-14 MED ORDER — TRIAMCINOLONE ACETONIDE 0.1 % EX CREA
1.0000 "application " | TOPICAL_CREAM | Freq: Two times a day (BID) | CUTANEOUS | Status: DC
  Administered 2018-08-14 – 2018-08-15 (×3): 1 via TOPICAL
  Filled 2018-08-14: qty 15

## 2018-08-14 MED ORDER — GABAPENTIN 300 MG PO CAPS
300.0000 mg | ORAL_CAPSULE | Freq: Two times a day (BID) | ORAL | Status: DC
  Administered 2018-08-14 – 2018-08-15 (×3): 300 mg via ORAL
  Filled 2018-08-14 (×4): qty 1

## 2018-08-14 MED ORDER — GABAPENTIN 300 MG PO CAPS
900.0000 mg | ORAL_CAPSULE | Freq: Every day | ORAL | Status: DC
  Administered 2018-08-14: 900 mg via ORAL
  Filled 2018-08-14: qty 9

## 2018-08-14 MED ORDER — ALPRAZOLAM 0.5 MG PO TABS
0.5000 mg | ORAL_TABLET | Freq: Two times a day (BID) | ORAL | Status: DC | PRN
  Administered 2018-08-14 (×2): 0.5 mg via ORAL
  Filled 2018-08-14 (×3): qty 1

## 2018-08-14 MED ORDER — HYDROCODONE-ACETAMINOPHEN 10-325 MG PO TABS
1.0000 | ORAL_TABLET | Freq: Four times a day (QID) | ORAL | Status: DC | PRN
  Administered 2018-08-14 – 2018-08-15 (×3): 1 via ORAL
  Filled 2018-08-14 (×3): qty 1

## 2018-08-14 MED ORDER — ALLOPURINOL 100 MG PO TABS
300.0000 mg | ORAL_TABLET | Freq: Every day | ORAL | Status: DC
  Administered 2018-08-14 – 2018-08-15 (×2): 300 mg via ORAL
  Filled 2018-08-14 (×2): qty 3

## 2018-08-14 MED ORDER — BUPROPION HCL ER (XL) 150 MG PO TB24
150.0000 mg | ORAL_TABLET | Freq: Every day | ORAL | Status: DC
  Administered 2018-08-14 – 2018-08-15 (×2): 150 mg via ORAL
  Filled 2018-08-14 (×2): qty 1

## 2018-08-14 MED ORDER — SODIUM CHLORIDE 0.9 % IV BOLUS
1000.0000 mL | Freq: Once | INTRAVENOUS | Status: AC
  Administered 2018-08-14: 1000 mL via INTRAVENOUS

## 2018-08-14 MED ORDER — ALBUTEROL SULFATE (2.5 MG/3ML) 0.083% IN NEBU
2.5000 mg | INHALATION_SOLUTION | Freq: Four times a day (QID) | RESPIRATORY_TRACT | Status: DC | PRN
  Filled 2018-08-14: qty 3

## 2018-08-14 NOTE — ED Notes (Signed)
SDU  Breakfast ordered  

## 2018-08-14 NOTE — ED Notes (Signed)
Patient is constantly moving around on bed, wanting to sit up on the side of the bed, however repositioned patient in bed,

## 2018-08-14 NOTE — ED Notes (Signed)
Family at bedside. 

## 2018-08-14 NOTE — Progress Notes (Signed)
[facility]  PROGRESS NOTE    Cameron Moran  JSH:[id number] DOB: Dec 20, 1950 DOA: 08/13/2018 PCP: Maury Dus, MD   Brief Narrative:   Cameron Moran is a 68 y.o. male with medical history significant of asthma, CAD, depression, gout, hyperlipidemia, hypertension, morbid obesity, sleep apnea on home oxygen, type 2 diabetes who is coming to the emergency department due to progressively worse weakness and nervousness since yesterday.  Dr. Hamdi Vari Nine and Dr. Tamera Punt spoke to some of the patient's relatives, which had concerns for the patient overusing his hydrocodone.  They were concerned that he had not had anything to drink in at least 24 hours, but did not add anything further.  The patient stated basically that he was feeling weak and lightheaded, but did not add anything else to the history.  He denies fever, chills, sore throat, productive cough, chest pain, palpitations, diaphoresis, PND or orthopnea.  No abdominal pain, nausea or emesis, diarrhea or constipation, melena or hematochezia.  He denies dysuria, frequency or hematuria, but states he has urinated less than usual in the past 2 days.  No polyuria, polydipsia, polyphagia or blurred vision.   Assessment & Plan:   Principal Problem:   AKI (acute kidney injury) ([place]) Active Problems:   Type 2 diabetes mellitus ([place])   Essential hypertension   Sleep apnea   Hyperlipidemia   CAD (coronary artery disease)   AKI     - Hold lisinopril.     - IV hydration.     - Monitor intake and output.     - UA ok; SCr improving monitor  Essential hypertension     - Hold lisinopril due to AKI and hypotension.     - Monitor blood pressure, renal function and electrolytes.  Heart murmur     - echo results pending   Sleep apnea     - on CPAP at home; ordered now qHS and naps  Hyperlipidemia     - Not on medical therapy.     - start statin  CAD (coronary artery disease)     - ASA, atorvastatin     - Should follow-up with cardiology.  Type 2  diabetes mellitus (HCC)     - Carbohydrate modified diet.     - Hold metformin due to AKI.     - CBG monitoring with regular insulin sliding scale.  Morbid obesity     - diet/exercise     - follow up with PCP Re: bariatric surgery  DVT prophylaxis: SCDs Code Status: FULL   Disposition Plan: TBD   Consultants:   None   Subjective: "I just want to go home."  Objective: Vitals:   08/14/18 0443 08/14/18 0646 08/14/18 0808 08/14/18 1146  BP: (!) 114/45 111/68 (!) 115/58 (!) 115/44  Pulse: 90 87 89 84  Resp: 20 20 (!) 21 15  Temp:  98.1 F (36.7 C) 98.1 F (36.7 C) 98 F (36.7 C)  TempSrc:  Oral Oral Oral  SpO2: 94% 95% 95% 94%    Intake/Output Summary (Last 24 hours) at 08/14/2018 1429 Last data filed at 08/14/2018 1400 Gross per 24 hour  Intake 5946.05 ml  Output 1620 ml  Net 4326.05 ml   There were no vitals filed for this visit.  Examination:  General: 68 y.o. male resting in bed in NAD Cardiovascular: RRR, +S1, S2, no m/g/r, equal pulses throughout Respiratory: CTABL, no w/r/r, normal WOB GI: BS+, NDNT, no masses noted, no organomegaly noted, morbidly obese MSK: No c/c; BLE edema,  chronic venous changes, multiple abrasions that he continues to pick at w/ some erythema and warmth of the LLE anterior shin Skin: No rashes, bruises, ulcerations noted Neuro: A&O x 3, no focal deficits   Data Reviewed: I have personally reviewed following labs and imaging studies.  CBC: Recent Labs  Lab 08/13/18 1303 08/13/18 1327  WBC 8.5  --   NEUTROABS 5.8  --   HGB 11.4* 12.2*  HCT 36.4* 36.0*  MCV 101.4*  --   PLT 255  --    Basic Metabolic Panel: Recent Labs  Lab 08/13/18 1303 08/13/18 1327 08/14/18 0347  NA 134* 134* 135  K 5.1 5.1 4.5  CL 100  --  105  CO2 18*  --  19*  GLUCOSE 174*  --  143*  BUN 46*  --  42*  CREATININE 5.97*  --  3.44*  CALCIUM 8.3*  --  7.5*   GFR: CrCl cannot be calculated (Unknown ideal weight.). Liver Function Tests: Recent  Labs  Lab 08/13/18 1303 08/14/18 0347  AST 23 23  ALT 19 15  ALKPHOS 45 37*  BILITOT 0.9 1.0  PROT 7.0 6.1*  ALBUMIN 3.2* 2.8*   No results for input(s): LIPASE, AMYLASE in the last 168 hours. No results for input(s): AMMONIA in the last 168 hours. Coagulation Profile: No results for input(s): INR, PROTIME in the last 168 hours. Cardiac Enzymes: Recent Labs  Lab 08/13/18 1911  CKTOTAL 243   BNP (last 3 results) No results for input(s): PROBNP in the last 8760 hours. HbA1C: No results for input(s): HGBA1C in the last 72 hours. CBG: Recent Labs  Lab 08/14/18 0352 08/14/18 0813 08/14/18 1224  GLUCAP 126* 142* 164*   Lipid Profile: No results for input(s): CHOL, HDL, LDLCALC, TRIG, CHOLHDL, LDLDIRECT in the last 72 hours. Thyroid Function Tests: Recent Labs    08/13/18 1306  TSH 2.621   Anemia Panel: Recent Labs    08/14/18 0347  VITAMINB12 511  FOLATE 17.8  FERRITIN 103  TIBC 253  IRON 56  RETICCTPCT 1.4   Sepsis Labs: Recent Labs  Lab 08/13/18 1455 08/13/18 1920  LATICACIDVEN 1.9 1.4    Recent Results (from the past 240 hour(s))  SARS Coronavirus 2 (CEPHEID - Performed in [place] hospital lab), Hosp Order     Status: None   Collection Time: 08/13/18  1:57 PM   Specimen: Nasopharyngeal Swab  Result Value Ref Range Status   SARS Coronavirus 2 NEGATIVE NEGATIVE Final    Comment: (NOTE) If result is NEGATIVE SARS-CoV-2 target nucleic acids are NOT DETECTED. The SARS-CoV-2 RNA is generally detectable in upper and lower  respiratory specimens during the acute phase of infection. The lowest  concentration of SARS-CoV-2 viral copies this assay can detect is 250  copies / mL. A negative result does not preclude SARS-CoV-2 infection  and should not be used as the sole basis for treatment or other  patient management decisions.  A negative result may occur with  improper specimen collection / handling, submission of specimen other  than  nasopharyngeal swab, presence of viral mutation(s) within the  areas targeted by this assay, and inadequate number of viral copies  (<250 copies / mL). A negative result must be combined with clinical  observations, patient history, and epidemiological information. If result is POSITIVE SARS-CoV-2 target nucleic acids are DETECTED. The SARS-CoV-2 RNA is generally detectable in upper and lower  respiratory specimens dur ing the acute phase of infection.  Positive  results are  indicative of active infection with SARS-CoV-2.  Clinical  correlation with patient history and other diagnostic information is  necessary to determine patient infection status.  Positive results do  not rule out bacterial infection or co-infection with other viruses. If result is PRESUMPTIVE POSTIVE SARS-CoV-2 nucleic acids MAY BE PRESENT.   A presumptive positive result was obtained on the submitted specimen  and confirmed on repeat testing.  While 2019 novel coronavirus  (SARS-CoV-2) nucleic acids may be present in the submitted sample  additional confirmatory testing may be necessary for epidemiological  and / or clinical management purposes  to differentiate between  SARS-CoV-2 and other Sarbecovirus currently known to infect humans.  If clinically indicated additional testing with an alternate test  methodology [phone]) is advised. The SARS-CoV-2 RNA is generally  detectable in upper and lower respiratory sp ecimens during the acute  phase of infection. The expected result is Negative. Fact Sheet for Patients:  StrictlyIdeas.no Fact Sheet for Healthcare Providers: BankingDealers.co.za This test is not yet approved or cleared by the [place] FDA and has been authorized for detection and/or diagnosis of SARS-CoV-2 by FDA under an Emergency Use Authorization (EUA).  This EUA will remain in effect (meaning this test can be used) for the duration of  the COVID-19 declaration under Section 564(b)(1) of the Act, 21 U.S.C. section 360bbb-3(b)(1), unless the authorization is terminated or revoked sooner. Performed at [facility], [place] [address] [place], [place] [address]          Radiology Studies: Dg Chest Port 1 View  Result Date: 08/13/2018 CLINICAL DATA:  Shortness of breath. EXAM: PORTABLE CHEST 1 VIEW COMPARISON:  Chest x-ray dated April 19, 2016. FINDINGS: Stable cardiomediastinal silhouette with borderline cardiomegaly. Normal pulmonary vascularity. Left basilar opacity. No pleural effusion or pneumothorax. Unchanged elevation of the right hemidiaphragm. No acute osseous abnormality. IMPRESSION: 1. Left basilar atelectasis versus infiltrate. Electronically Signed   By: Titus Dubin M.D.   On: 08/13/2018 13:23        Scheduled Meds: . allopurinol  300 mg Oral Daily  . buPROPion  150 mg Oral Daily  . citalopram  20 mg Oral Daily  . gabapentin  300 mg Oral BID   And  . gabapentin  900 mg Oral QHS  . insulin aspart  0-20 Units Subcutaneous TID WC  . levothyroxine  224 mcg Oral Q breakfast  . triamcinolone cream  1 application Topical BID   Continuous Infusions: . sodium chloride 125 mL/hr at 08/14/18 1308     LOS: 1 day    Time spent: 25 minutes spent in the coordination of care today.    Jonnie Finner, DO [facility] Pager [phone]  If 7PM-7AM, please contact night-coverage [email or web address] Password [facility] 08/14/2018, 2:29 PM

## 2018-08-14 NOTE — Progress Notes (Signed)
Pt admitted to the unit from ED; pt alert and verbally responsive; pt oriented to the unit and room; VSS; telemetry applied and verified with CCMD: IV intact and transfusing; fall/safety precaution and prevention education completed with pt; call light within reach; pt remain on air bariatric bed; reported off to oncoming RN. Delia Heady RN   08/14/18 0646  Vitals  Temp 98.1 F (36.7 C)  Temp Source Oral  BP 111/68  MAP (mmHg) 79  BP Location Left Wrist  BP Method Automatic  Patient Position (if appropriate) Lying  Pulse Rate 87  Pulse Rate Source Monitor  Resp 20  Oxygen Therapy  SpO2 95 %  O2 Device Room Air  MEWS Score  MEWS RR 0  MEWS Pulse 0  MEWS Systolic 0  MEWS LOC 0  MEWS Temp 0  MEWS Score 0  MEWS Score Color Green

## 2018-08-14 NOTE — Progress Notes (Signed)
Pt was placed on CPAP machine for his nap with 2 L 02 bleed in.

## 2018-08-15 LAB — CBC WITH DIFFERENTIAL/PLATELET
Abs Immature Granulocytes: 0.03 10*3/uL (ref 0.00–0.07)
Basophils Absolute: 0 10*3/uL (ref 0.0–0.1)
Basophils Relative: 1 %
Eosinophils Absolute: 0.5 10*3/uL (ref 0.0–0.5)
Eosinophils Relative: 9 %
HCT: 32.1 % — ABNORMAL LOW (ref 39.0–52.0)
Hemoglobin: 10.6 g/dL — ABNORMAL LOW (ref 13.0–17.0)
Immature Granulocytes: 1 %
Lymphocytes Relative: 24 %
Lymphs Abs: 1.3 10*3/uL (ref 0.7–4.0)
MCH: 32.2 pg (ref 26.0–34.0)
MCHC: 33 g/dL (ref 30.0–36.0)
MCV: 97.6 fL (ref 80.0–100.0)
Monocytes Absolute: 0.7 10*3/uL (ref 0.1–1.0)
Monocytes Relative: 12 %
Neutro Abs: 2.9 10*3/uL (ref 1.7–7.7)
Neutrophils Relative %: 53 %
Platelets: 193 10*3/uL (ref 150–400)
RBC: 3.29 MIL/uL — ABNORMAL LOW (ref 4.22–5.81)
RDW: 14.2 % (ref 11.5–15.5)
WBC: 5.4 10*3/uL (ref 4.0–10.5)
nRBC: 0 % (ref 0.0–0.2)

## 2018-08-15 LAB — MAGNESIUM: Magnesium: 1.8 mg/dL (ref 1.7–2.4)

## 2018-08-15 LAB — BASIC METABOLIC PANEL
Anion gap: 10 (ref 5–15)
BUN: 27 mg/dL — ABNORMAL HIGH (ref 8–23)
CO2: 19 mmol/L — ABNORMAL LOW (ref 22–32)
Calcium: 8.1 mg/dL — ABNORMAL LOW (ref 8.9–10.3)
Chloride: 112 mmol/L — ABNORMAL HIGH (ref 98–111)
Creatinine, Ser: 1.37 mg/dL — ABNORMAL HIGH (ref 0.61–1.24)
GFR calc Af Amer: >60 mL/min (ref >60)
GFR calc non Af Amer: 52 mL/min — ABNORMAL LOW (ref >60)
Glucose, Bld: 131 mg/dL — ABNORMAL HIGH (ref 70–99)
Potassium: 4.9 mmol/L (ref 3.5–5.1)
Sodium: 141 mmol/L (ref 135–145)

## 2018-08-15 LAB — GLUCOSE, CAPILLARY
Glucose-Capillary: 154 mg/dL — ABNORMAL HIGH (ref 70–99)
Glucose-Capillary: 163 mg/dL — ABNORMAL HIGH (ref 70–99)

## 2018-08-15 MED ORDER — ACETAMINOPHEN 325 MG PO TABS
650.0000 mg | ORAL_TABLET | Freq: Two times a day (BID) | ORAL | Status: DC | PRN
  Administered 2018-08-15: 650 mg via ORAL
  Filled 2018-08-15: qty 2

## 2018-08-15 MED ORDER — CEPHALEXIN 500 MG PO CAPS
500.0000 mg | ORAL_CAPSULE | Freq: Two times a day (BID) | ORAL | Status: DC
  Administered 2018-08-15: 500 mg via ORAL
  Filled 2018-08-15: qty 1

## 2018-08-15 MED ORDER — CEPHALEXIN 500 MG PO CAPS: 500.0000 mg | ORAL_CAPSULE | Freq: Two times a day (BID) | ORAL | 0 refills | Status: AC

## 2018-08-15 MED ORDER — ATORVASTATIN CALCIUM 40 MG PO TABS: 40.0000 mg | ORAL_TABLET | Freq: Every day | ORAL | 3 refills | Status: AC

## 2018-08-15 NOTE — TOC Initial Note (Signed)
Transition of Care [facility]) - Initial/Assessment Note    Patient Details  Name: MAJID MCCRAVY MRN: [id number] Date of Birth: November 22, 1950  Transition of [facility]) CM/SW Contact:    Pollie Friar, RN Phone Number: 08/15/2018, 4:46 PM  Clinical Narrative:                   Expected Discharge Plan: Home w Home Health Services Barriers to Discharge: No Barriers Identified   Patient Goals and CMS Choice   CMS Medicare.gov Compare Post Acute Care list provided to:: Patient Represenative (must comment) Choice offered to / list presented to : Spouse  Expected Discharge Plan and Services Expected Discharge Plan: [place]   Discharge Planning Services: CM Consult Post Acute Care Choice: Home Health   Expected Discharge Date: 08/15/18                         HH Arranged: PT HH Agency: [facility] Date [place]: 08/15/18 Time [place]: [place] Representative spoke with at [place]: Dorian Pod  Prior Living Arrangements/Services   Lives with:: Spouse Patient language and need for interpreter reviewed:: Yes Do you feel safe going back to the place where you live?: Yes      Need for Family Participation in Patient Care: Yes (Comment) Care giver support system in place?: Yes (comment)   Criminal Activity/Legal Involvement Pertinent to Current Situation/Hospitalization: No - Comment as needed  Activities of Daily Living      Permission Sought/Granted                  Emotional Assessment Appearance:: Appears stated age Attitude/Demeanor/Rapport: Engaged Affect (typically observed): Accepting, Pleasant Orientation: : Oriented to Self, Oriented to Place   Psych Involvement: No (comment)  Admission diagnosis:  Hypovolemic shock ([place]) [R57.1] AKI (acute kidney injury) ([place]) [N17.9] Patient Active Problem List   Diagnosis Date Noted  . AKI (acute kidney injury) ([place]) 08/13/2018  . Hyperlipidemia   . CAD (coronary artery  disease)   . COLONIC POLYPS, HYPERPLASTIC 04/15/2007  . Type 2 diabetes mellitus ([place]) 04/15/2007  . MORBID OBESITY 04/15/2007  . ANXIETY 04/15/2007  . DEPRESSION 04/15/2007  . Essential hypertension 04/15/2007  . CORONARY ARTERY DISEASE 04/15/2007  . ASTHMA 04/15/2007  . Sleep apnea 04/15/2007   PCP:  Maury Dus, MD Pharmacy:   [facility], [place] [address] Phone: [phone] Fax: [phone]     Social Determinants of Health (SDOH) Interventions    Readmission Risk Interventions No flowsheet data found.

## 2018-08-15 NOTE — Progress Notes (Signed)
Pt wanted to leave AMA at 2100. Pull out all tele and IV. Provided education. Pt willing to stay overnight.   Pt refused tele, takes off electrode .  Pt takes off CPAP refused to put it on overnight.

## 2018-08-15 NOTE — Evaluation (Signed)
Physical Therapy Evaluation Patient Details Name: Cameron Moran MRN: [id number] DOB: 12-30-1950 Today's Date: 08/15/2018   History of Present Illness  Patient is a 68 y/o male presenting to the ED on 08/13/2018 with primary complaints of weakness. Past medical history significant of asthma, CAD, depression, gout, hyperlipidemia, hypertension, morbid obesity, sleep apnea on home oxygen, type 2 diabetes. Admitted for AKI.    Clinical Impression  Patient admitted with the above listed diagnosis. Patient reports Mod I with mobility prior to admission - lives with wife who is able to assist with ADLs and IADLs as needed. Patient today performing bed mobility and transfers at general min guard level with required increased time and effort - body habitus and air bed limiting more independent mobility. PT to recommend HHPT vs OPPT at discharge based on transportation availability. PT to follow acutely.     Follow Up Recommendations Home health PT;Outpatient PT;Supervision/Assistance - 24 hour    Equipment Recommendations  None recommended by PT    Recommendations for Other Services       Precautions / Restrictions Precautions Precautions: Fall Restrictions Weight Bearing Restrictions: No      Mobility  Bed Mobility Overal bed mobility: Needs Assistance Bed Mobility: Supine to Sit;Sit to Supine     Supine to sit: Min guard Sit to supine: Min guard   General bed mobility comments: increased time and effort - body habitus and air bed limiting  Transfers Overall transfer level: Needs assistance Equipment used: Rolling walker (2 wheeled) Transfers: Sit to/from Stand Sit to Stand: Min guard         General transfer comment: min guard from beside x 2 trials - no LOB  Ambulation/Gait Ambulation/Gait assistance: Min guard   Assistive device: Rolling walker (2 wheeled)   Gait velocity: decreased   General Gait Details: side stepping at bedside per pateitn request did not feel  up to ambulating in room - states he is ambulatory at baseline  Stairs            Wheelchair Mobility    Modified Rankin (Stroke Patients Only)       Balance Overall balance assessment: Mild deficits observed, not formally tested                                           Pertinent Vitals/Pain Pain Assessment: No/denies pain    Home Living Family/patient expects to be discharged to:: Private residence Living Arrangements: Spouse/significant other Available Help at Discharge: Friend(s);Available 24 hours/day Type of Home: House Home Access: Stairs to enter Entrance Stairs-Rails: (post) Entrance Stairs-Number of Steps: 2 Home Layout: One level Home Equipment: Walker - 2 wheels;Cane - single point      Prior Function Level of Independence: Independent         Comments: uses SPC vs RW at home; wife available to assist with ADLs     Hand Dominance        Extremity/Trunk Assessment   Upper Extremity Assessment Upper Extremity Assessment: Defer to OT evaluation    Lower Extremity Assessment Lower Extremity Assessment: Generalized weakness       Communication   Communication: No difficulties  Cognition Arousal/Alertness: Awake/alert Behavior During Therapy: WFL for tasks assessed/performed Overall Cognitive Status: Within Functional Limits for tasks assessed  General Comments      Exercises     Assessment/Plan    PT Assessment Patient needs continued PT services  PT Problem List Decreased strength;Decreased activity tolerance;Decreased balance;Decreased mobility;Decreased knowledge of use of DME;Decreased safety awareness;Decreased knowledge of precautions       PT Treatment Interventions DME instruction;Gait training;Stair training;Functional mobility training;Therapeutic activities;Therapeutic exercise;Balance training;Patient/family education    PT Goals (Current goals  can be found in the Care Plan section)  Acute Rehab PT Goals Patient Stated Goal: return home today PT Goal Formulation: With patient Time For Goal Achievement: 08/29/18 Potential to Achieve Goals: Good    Frequency Min 3X/week   Barriers to discharge        Co-evaluation               AM-PAC PT "6 Clicks" Mobility  Outcome Measure Help needed turning from your back to your side while in a flat bed without using bedrails?: A Little Help needed moving from lying on your back to sitting on the side of a flat bed without using bedrails?: A Little Help needed moving to and from a bed to a chair (including a wheelchair)?: A Little Help needed standing up from a chair using your arms (e.g., wheelchair or bedside chair)?: A Little Help needed to walk in hospital room?: A Lot Help needed climbing 3-5 steps with a railing? : A Lot 6 Click Score: 16    End of Session Equipment Utilized During Treatment: Oxygen Activity Tolerance: Patient tolerated treatment well Patient left: in bed;with call bell/phone within reach Nurse Communication: Mobility status PT Visit Diagnosis: Unsteadiness on feet (R26.81);Other abnormalities of gait and mobility (R26.89);Muscle weakness (generalized) (M62.81)    Time: 6629-4765 PT Time Calculation (min) (ACUTE ONLY): 23 min   Charges:   PT Evaluation $PT Eval Moderate Complexity: 1 Mod PT Treatments $Therapeutic Activity: 8-22 mins         Lanney Gins, PT, DPT Supplemental Physical Therapist 08/15/18 1:22 PM Pager: [phone] Office: [phone]

## 2018-08-15 NOTE — Discharge Summary (Signed)
. Physician Discharge Summary  Cameron Moran JSH:[id number] DOB: November 26, 1950 DOA: 08/13/2018  PCP: Maury Dus, MD  Admit date: 08/13/2018 Discharge date: 08/15/2018  Admitted From: Home Disposition:  Discharged to home with HHPT.  Recommendations for Outpatient Follow-up:  1. Follow up with PCP in 1-2 weeks 2. Please obtain BMP/CBC in one week  Discharge Condition: Stable  CODE STATUS: FULL   Brief/Interim Summary: Cameron Moran a 68 y.o.malewith medical history significant ofasthma, CAD, depression, gout, hyperlipidemia, hypertension, morbid obesity, sleep apnea on home oxygen, type 2 diabetes who is coming to the emergency department due toprogressively worse weakness and nervousness since yesterday.Dr. Priyana Mccarey Nine and Dr. Tamera Punt spoke to some of the patient's relatives, which had concerns for the patient overusing his hydrocodone. They were concerned that he had not had anything to drink in at least 24 hours, but did not add anything further. The patient stated basically that he was feeling weak and lightheaded, but did not add anything else to the history. He denies fever, chills, sore throat, productive cough, chest pain, palpitations, diaphoresis, PND or orthopnea. No abdominal pain, nausea or emesis, diarrhea or constipation, melena or hematochezia. He denies dysuria, frequency or hematuria, but states he has urinated less than usual in the past 2 days. No polyuria, polydipsia, polyphagia or blurred vision.  Discharge Diagnoses:  Principal Problem:   AKI (acute kidney injury) ([place]) Active Problems:   Type 2 diabetes mellitus ([place])   Essential hypertension   Sleep apnea   Hyperlipidemia   CAD (coronary artery disease)  AKI     - Hold lisinopril.     - IV hydration.     - Monitor intake and output.     - UA ok; SCr improving monitor     - SCr is 1.37 this AM; stop IVF, he wants to go home     - follow up with PCP in 3 - 5 days for renal function check; resume  lisinopril at discharge  Essential hypertension     - Hold lisinopril due to AKI and hypotension.     - Monitor blood pressure, renal function and electrolytes.     - BP is better; can continue lisinopril at discharge  Heart murmur     - echo results: EF 55-60%  Sleep apnea     - on CPAP at home; ordered now qHS and naps  Hyperlipidemia     - Not on medical therapy.     - start atorvastatin 40 mg qHS  CAD (coronary artery disease)     - ASA, atorvastatin     - Should follow-up with cardiology.  Type 2 diabetes mellitus ([facility])     - Carbohydrate modified diet.     - Hold metformin due to AKI.     - CBG monitoring with regular insulin sliding scale.  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      Medication List    TAKE these medications   allopurinol 300 MG tablet Commonly known as: ZYLOPRIM Take 300 mg by mouth daily.   ALPRAZolam 0.25 MG tablet  Commonly known as: XANAX Take 0.5 mg by mouth 2 (two) times daily as needed for anxiety.   atorvastatin 40 MG tablet Commonly known as: Lipitor Take 1 tablet (40 mg total) by mouth daily.   buPROPion 150 MG 24 hr tablet Commonly known as: WELLBUTRIN XL Take 150 mg by mouth daily.   cephALEXin 500 MG capsule Commonly known as: KEFLEX Take 1 capsule (500 mg total) by mouth every 12 (twelve) hours for 7 days.   citalopram 20 MG tablet Commonly known as: CELEXA Take 20 mg by mouth daily.   gabapentin 300 MG capsule Commonly known as: NEURONTIN Take 300 mg by mouth See admin instructions. Take 300 mg  In the morning and the afternoon 900 mg in the evening   HYDROcodone-acetaminophen 10-325 MG  tablet Commonly known as: NORCO Take 1 tablet by mouth every 6 (six) hours as needed.   Levothyroxine Sodium 112 MCG Caps Take 224 mcg by mouth daily with breakfast.   lisinopril 10 MG tablet Commonly known as: ZESTRIL Take 10 mg by mouth daily.   metFORMIN 1000 MG tablet Commonly known as: GLUCOPHAGE Take 1,000 mg by mouth 2 (two) times daily with a meal.   triamcinolone cream 0.1 % Commonly known as: KENALOG Apply 1 application topically 2 (two) times daily.       Allergies  Allergen Reactions  . Doxycycline Calcium Other (See Comments)    Per [facility] chart note 05/31/11.    Consultations:  None   Procedures/Studies: Dg Chest Port 1 View  Result Date: 08/13/2018 CLINICAL DATA:  Shortness of breath. EXAM: PORTABLE CHEST 1 VIEW COMPARISON:  Chest x-ray dated April 19, 2016. FINDINGS: Stable cardiomediastinal silhouette with borderline cardiomegaly. Normal pulmonary vascularity. Left basilar opacity. No pleural effusion or pneumothorax. Unchanged elevation of the right hemidiaphragm. No acute osseous abnormality. IMPRESSION: 1. Left basilar atelectasis versus infiltrate. Electronically Signed   By: Titus Dubin M.D.   On: 08/13/2018 13:23      Subjective: "I want to go home. I do better there."  Discharge Exam: Vitals:   08/15/18 0442 08/15/18 0700  BP: 134/62 131/60  Pulse: 85 75  Resp: (!) 22 (!) 22  Temp: 98.5 F (36.9 C) 98.2 F (36.8 C)  SpO2: 98% 95%   Vitals:   08/14/18 2302 08/15/18 0200 08/15/18 0442 08/15/18 0700  BP: 121/66  134/62 131/60  Pulse: 87 80 85 75  Resp: (!) 21 17 (!) 22 (!) 22  Temp: (!) 97.5 F (36.4 C)  98.5 F (36.9 C) 98.2 F (36.8 C)  TempSrc: Oral   Axillary  SpO2: 97% 97% 98% 95%    General: 68 y.o. male resting in bed in NAD Cardiovascular: RRR, +S1, S2, no m/g/r, equal pulses throughout Respiratory: CTABL, no w/r/r, normal WOB GI: BS+, NDNT, no masses noted, no organomegaly noted, morbidly obese MSK: No c/c; BLE  edema, chronic venous changes, multiple abrasions that he continues to pick at w/ some erythema and warmth of the LLE anterior shin Skin: No rashes, bruises, ulcerations noted Neuro: A&O x 3, no focal deficits    The results of significant diagnostics from this hospitalization (including imaging, microbiology, ancillary and laboratory) are listed below for reference.     Microbiology: Recent Results (from the past 240 hour(s))  SARS Coronavirus 2 (CEPHEID - Performed in [place] hospital lab), Hosp Order     Status: None   Collection Time: 08/13/18  1:57 PM   Specimen: Nasopharyngeal Swab  Result Value Ref Range Status  SARS Coronavirus 2 NEGATIVE NEGATIVE Final    Comment: (NOTE) If result is NEGATIVE SARS-CoV-2 target nucleic acids are NOT DETECTED. The SARS-CoV-2 RNA is generally detectable in upper and lower  respiratory specimens during the acute phase of infection. The lowest  concentration of SARS-CoV-2 viral copies this assay can detect is 250  copies / mL. A negative result does not preclude SARS-CoV-2 infection  and should not be used as the sole basis for treatment or other  patient management decisions.  A negative result may occur with  improper specimen collection / handling, submission of specimen other  than nasopharyngeal swab, presence of viral mutation(s) within the  areas targeted by this assay, and inadequate number of viral copies  (<250 copies / mL). A negative result must be combined with clinical  observations, patient history, and epidemiological information. If result is POSITIVE SARS-CoV-2 target nucleic acids are DETECTED. The SARS-CoV-2 RNA is generally detectable in upper and lower  respiratory specimens dur ing the acute phase of infection.  Positive  results are indicative of active infection with SARS-CoV-2.  Clinical  correlation with patient history and other diagnostic information is  necessary to determine patient infection status.   Positive results do  not rule out bacterial infection or co-infection with other viruses. If result is PRESUMPTIVE POSTIVE SARS-CoV-2 nucleic acids MAY BE PRESENT.   A presumptive positive result was obtained on the submitted specimen  and confirmed on repeat testing.  While 2019 novel coronavirus  (SARS-CoV-2) nucleic acids may be present in the submitted sample  additional confirmatory testing may be necessary for epidemiological  and / or clinical management purposes  to differentiate between  SARS-CoV-2 and other Sarbecovirus currently known to infect humans.  If clinically indicated additional testing with an alternate test  methodology [id number]) is advised. The SARS-CoV-2 RNA is generally  detectable in upper and lower respiratory sp ecimens during the acute  phase of infection. The expected result is Negative. Fact Sheet for Patients:  StrictlyIdeas.no Fact Sheet for Healthcare Providers: BankingDealers.co.za This test is not yet approved or cleared by the [place] FDA and has been authorized for detection and/or diagnosis of SARS-CoV-2 by FDA under an Emergency Use Authorization (EUA).  This EUA will remain in effect (meaning this test can be used) for the duration of the COVID-19 declaration under Section 564(b)(1) of the Act, 21 U.S.C. section 360bbb-3(b)(1), unless the authorization is terminated or revoked sooner. Performed at [facility], [place] [address] [place], [place] [address]      Labs: BNP (last 3 results) Recent Labs    08/13/18 1305 08/14/18 0357  BNP 140.3* 270.6*   Basic Metabolic Panel: Recent Labs  Lab 08/13/18 1303 08/13/18 1327 08/14/18 0347 08/15/18 0417  NA 134* 134* 135 141  K 5.1 5.1 4.5 4.9  CL 100  --  105 112*  CO2 18*  --  19* 19*  GLUCOSE 174*  --  143* 131*  BUN 46*  --  42* 27*  CREATININE 5.97*  --  3.44* 1.37*  CALCIUM 8.3*  --  7.5* 8.1*  MG  --   --   --  1.8    Liver Function Tests: Recent Labs  Lab 08/13/18 1303 08/14/18 0347  AST 23 23  ALT 19 15  ALKPHOS 45 37*  BILITOT 0.9 1.0  PROT 7.0 6.1*  ALBUMIN 3.2* 2.8*   No results for input(s): LIPASE, AMYLASE in the last 168 hours. No results for input(s): AMMONIA in the  last 168 hours. CBC: Recent Labs  Lab 08/13/18 1303 08/13/18 1327 08/15/18 0417  WBC 8.5  --  5.4  NEUTROABS 5.8  --  2.9  HGB 11.4* 12.2* 10.6*  HCT 36.4* 36.0* 32.1*  MCV 101.4*  --  97.6  PLT 255  --  193   Cardiac Enzymes: Recent Labs  Lab 08/13/18 1911  CKTOTAL 243   BNP: Invalid input(s): POCBNP CBG: Recent Labs  Lab 08/14/18 0813 08/14/18 1224 08/14/18 1633 08/14/18 2136 08/15/18 0559  GLUCAP 142* 164* 149* 157* 154*   D-Dimer No results for input(s): DDIMER in the last 72 hours. Hgb A1c No results for input(s): HGBA1C in the last 72 hours. Lipid Profile No results for input(s): CHOL, HDL, LDLCALC, TRIG, CHOLHDL, LDLDIRECT in the last 72 hours. Thyroid function studies Recent Labs    08/13/18 1306  TSH 2.621   Anemia work up Recent Labs    08/14/18 0347  VITAMINB12 511  FOLATE 17.8  FERRITIN 103  TIBC 253  IRON 56  RETICCTPCT 1.4   Urinalysis    Component Value Date/Time   COLORURINE YELLOW 08/13/2018 2320   APPEARANCEUR CLEAR 08/13/2018 2320   LABSPEC 1.012 08/13/2018 2320   PHURINE 5.0 08/13/2018 2320   GLUCOSEU NEGATIVE 08/13/2018 2320   HGBUR SMALL (A) 08/13/2018 2320   BILIRUBINUR NEGATIVE 08/13/2018 2320   KETONESUR NEGATIVE 08/13/2018 2320   PROTEINUR NEGATIVE 08/13/2018 2320   UROBILINOGEN 0.2 05/09/2010 2036   NITRITE NEGATIVE 08/13/2018 2320   LEUKOCYTESUR TRACE (A) 08/13/2018 2320   Sepsis Labs Invalid input(s): PROCALCITONIN,  WBC,  LACTICIDVEN Microbiology Recent Results (from the past 240 hour(s))  SARS Coronavirus 2 (CEPHEID - Performed in [place] hospital lab), Hosp Order     Status: None   Collection Time: 08/13/18  1:57 PM   Specimen:  Nasopharyngeal Swab  Result Value Ref Range Status   SARS Coronavirus 2 NEGATIVE NEGATIVE Final    Comment: (NOTE) If result is NEGATIVE SARS-CoV-2 target nucleic acids are NOT DETECTED. The SARS-CoV-2 RNA is generally detectable in upper and lower  respiratory specimens during the acute phase of infection. The lowest  concentration of SARS-CoV-2 viral copies this assay can detect is 250  copies / mL. A negative result does not preclude SARS-CoV-2 infection  and should not be used as the sole basis for treatment or other  patient management decisions.  A negative result may occur with  improper specimen collection / handling, submission of specimen other  than nasopharyngeal swab, presence of viral mutation(s) within the  areas targeted by this assay, and inadequate number of viral copies  (<250 copies / mL). A negative result must be combined with clinical  observations, patient history, and epidemiological information. If result is POSITIVE SARS-CoV-2 target nucleic acids are DETECTED. The SARS-CoV-2 RNA is generally detectable in upper and lower  respiratory specimens dur ing the acute phase of infection.  Positive  results are indicative of active infection with SARS-CoV-2.  Clinical  correlation with patient history and other diagnostic information is  necessary to determine patient infection status.  Positive results do  not rule out bacterial infection or co-infection with other viruses. If result is PRESUMPTIVE POSTIVE SARS-CoV-2 nucleic acids MAY BE PRESENT.   A presumptive positive result was obtained on the submitted specimen  and confirmed on repeat testing.  While 2019 novel coronavirus  (SARS-CoV-2) nucleic acids may be present in the submitted sample  additional confirmatory testing may be necessary for epidemiological  and / or clinical  management purposes  to differentiate between  SARS-CoV-2 and other Sarbecovirus currently known to infect humans.  If clinically  indicated additional testing with an alternate test  methodology [phone]) is advised. The SARS-CoV-2 RNA is generally  detectable in upper and lower respiratory sp ecimens during the acute  phase of infection. The expected result is Negative. Fact Sheet for Patients:  StrictlyIdeas.no Fact Sheet for Healthcare Providers: BankingDealers.co.za This test is not yet approved or cleared by the [place] FDA and has been authorized for detection and/or diagnosis of SARS-CoV-2 by FDA under an Emergency Use Authorization (EUA).  This EUA will remain in effect (meaning this test can be used) for the duration of the COVID-19 declaration under Section 564(b)(1) of the Act, 21 U.S.C. section 360bbb-3(b)(1), unless the authorization is terminated or revoked sooner. Performed at [facility], [place] [address] [place], [place] [address]      Time coordinating discharge: 35 minutes  SIGNED:   Jonnie Finner, DO  [facility] 08/15/2018, 10:02 AM Pager   If 7PM-7AM, please contact night-coverage [email or web address] Password [email or web address]

## 2018-08-15 NOTE — TOC Transition Note (Signed)
Transition of Care [facility]) - CM/SW Discharge Note   Patient Details  Name: Cameron Moran MRN: [id number] Date of Birth: 09/17/1950  [facility] CM/SW Contact:  Pollie Friar, RN Phone Number: 08/15/2018, 4:46 PM   Clinical Narrative:    Pt discharging home with [facility] services. [facility] unable to accept the referral but [facility] accepted and pt agreeable.  Wife providing transport home.   Final next level of care: Home w Home Health Services Barriers to Discharge: No Barriers Identified   Patient Goals and CMS Choice   CMS Medicare.gov Compare Post Acute Care list provided to:: Patient Represenative (must comment) Choice offered to / list presented to : Spouse  Discharge Placement                       Discharge Plan and Services   Discharge Planning Services: CM Consult Post Acute Care Choice: Home Health                    HH Arranged: PT [facility] Agency: [facility] Date [facility] Agency Contacted: 08/15/18 Time [place]: [place] Representative spoke with at [place]: [place] ([place]) Interventions     Readmission Risk Interventions No flowsheet data found.

## 2018-08-15 NOTE — Progress Notes (Signed)
Patient being discharged home with home health. Education and instructions given to patient. IV removed. CCMD notified. Patient leaving unit via wheelchair. All belongings with patient.

## 2020-03-17 DIAGNOSIS — G4733 Obstructive sleep apnea (adult) (pediatric): Secondary | ICD-10-CM | POA: Diagnosis not present

## 2020-03-17 DIAGNOSIS — I10 Essential (primary) hypertension: Secondary | ICD-10-CM | POA: Diagnosis not present

## 2020-03-17 DIAGNOSIS — M179 Osteoarthritis of knee, unspecified: Secondary | ICD-10-CM | POA: Diagnosis not present

## 2020-03-17 DIAGNOSIS — E1169 Type 2 diabetes mellitus with other specified complication: Secondary | ICD-10-CM | POA: Diagnosis not present

## 2020-03-17 DIAGNOSIS — M170 Bilateral primary osteoarthritis of knee: Secondary | ICD-10-CM | POA: Diagnosis not present

## 2020-03-17 DIAGNOSIS — E1149 Type 2 diabetes mellitus with other diabetic neurological complication: Secondary | ICD-10-CM | POA: Diagnosis not present

## 2020-03-17 DIAGNOSIS — E1165 Type 2 diabetes mellitus with hyperglycemia: Secondary | ICD-10-CM | POA: Diagnosis not present

## 2020-03-17 DIAGNOSIS — E782 Mixed hyperlipidemia: Secondary | ICD-10-CM | POA: Diagnosis not present

## 2020-03-17 DIAGNOSIS — E039 Hypothyroidism, unspecified: Secondary | ICD-10-CM | POA: Diagnosis not present

## 2020-04-12 DIAGNOSIS — I10 Essential (primary) hypertension: Secondary | ICD-10-CM | POA: Diagnosis not present

## 2020-04-12 DIAGNOSIS — M170 Bilateral primary osteoarthritis of knee: Secondary | ICD-10-CM | POA: Diagnosis not present

## 2020-04-12 DIAGNOSIS — E1165 Type 2 diabetes mellitus with hyperglycemia: Secondary | ICD-10-CM | POA: Diagnosis not present

## 2020-04-12 DIAGNOSIS — E1149 Type 2 diabetes mellitus with other diabetic neurological complication: Secondary | ICD-10-CM | POA: Diagnosis not present

## 2020-04-12 DIAGNOSIS — E782 Mixed hyperlipidemia: Secondary | ICD-10-CM | POA: Diagnosis not present

## 2020-04-12 DIAGNOSIS — E039 Hypothyroidism, unspecified: Secondary | ICD-10-CM | POA: Diagnosis not present

## 2020-04-12 DIAGNOSIS — E1169 Type 2 diabetes mellitus with other specified complication: Secondary | ICD-10-CM | POA: Diagnosis not present

## 2020-04-17 DIAGNOSIS — G4733 Obstructive sleep apnea (adult) (pediatric): Secondary | ICD-10-CM | POA: Diagnosis not present

## 2020-04-19 DIAGNOSIS — M25569 Pain in unspecified knee: Secondary | ICD-10-CM | POA: Diagnosis not present

## 2020-04-19 DIAGNOSIS — Z9181 History of falling: Secondary | ICD-10-CM | POA: Diagnosis not present

## 2020-04-19 DIAGNOSIS — M25512 Pain in left shoulder: Secondary | ICD-10-CM | POA: Diagnosis not present

## 2020-04-19 DIAGNOSIS — E559 Vitamin D deficiency, unspecified: Secondary | ICD-10-CM | POA: Diagnosis not present

## 2020-04-19 DIAGNOSIS — Z79899 Other long term (current) drug therapy: Secondary | ICD-10-CM | POA: Diagnosis not present

## 2020-04-19 DIAGNOSIS — Z1159 Encounter for screening for other viral diseases: Secondary | ICD-10-CM | POA: Diagnosis not present

## 2020-04-19 DIAGNOSIS — E1142 Type 2 diabetes mellitus with diabetic polyneuropathy: Secondary | ICD-10-CM | POA: Diagnosis not present

## 2020-04-19 DIAGNOSIS — M129 Arthropathy, unspecified: Secondary | ICD-10-CM | POA: Diagnosis not present

## 2020-05-06 DIAGNOSIS — E1169 Type 2 diabetes mellitus with other specified complication: Secondary | ICD-10-CM | POA: Diagnosis not present

## 2020-05-11 DIAGNOSIS — E782 Mixed hyperlipidemia: Secondary | ICD-10-CM | POA: Diagnosis not present

## 2020-05-11 DIAGNOSIS — E039 Hypothyroidism, unspecified: Secondary | ICD-10-CM | POA: Diagnosis not present

## 2020-05-11 DIAGNOSIS — M170 Bilateral primary osteoarthritis of knee: Secondary | ICD-10-CM | POA: Diagnosis not present

## 2020-05-11 DIAGNOSIS — I10 Essential (primary) hypertension: Secondary | ICD-10-CM | POA: Diagnosis not present

## 2020-05-11 DIAGNOSIS — M179 Osteoarthritis of knee, unspecified: Secondary | ICD-10-CM | POA: Diagnosis not present

## 2020-05-11 DIAGNOSIS — E1169 Type 2 diabetes mellitus with other specified complication: Secondary | ICD-10-CM | POA: Diagnosis not present

## 2020-05-11 DIAGNOSIS — E1165 Type 2 diabetes mellitus with hyperglycemia: Secondary | ICD-10-CM | POA: Diagnosis not present

## 2020-05-11 DIAGNOSIS — E1149 Type 2 diabetes mellitus with other diabetic neurological complication: Secondary | ICD-10-CM | POA: Diagnosis not present

## 2020-05-15 DIAGNOSIS — G4733 Obstructive sleep apnea (adult) (pediatric): Secondary | ICD-10-CM | POA: Diagnosis not present

## 2020-06-08 DIAGNOSIS — G4733 Obstructive sleep apnea (adult) (pediatric): Secondary | ICD-10-CM | POA: Diagnosis not present

## 2020-06-10 DIAGNOSIS — E039 Hypothyroidism, unspecified: Secondary | ICD-10-CM | POA: Diagnosis not present

## 2020-06-10 DIAGNOSIS — E1165 Type 2 diabetes mellitus with hyperglycemia: Secondary | ICD-10-CM | POA: Diagnosis not present

## 2020-06-10 DIAGNOSIS — I10 Essential (primary) hypertension: Secondary | ICD-10-CM | POA: Diagnosis not present

## 2020-06-10 DIAGNOSIS — M179 Osteoarthritis of knee, unspecified: Secondary | ICD-10-CM | POA: Diagnosis not present

## 2020-06-10 DIAGNOSIS — E1169 Type 2 diabetes mellitus with other specified complication: Secondary | ICD-10-CM | POA: Diagnosis not present

## 2020-06-10 DIAGNOSIS — E1149 Type 2 diabetes mellitus with other diabetic neurological complication: Secondary | ICD-10-CM | POA: Diagnosis not present

## 2020-06-10 DIAGNOSIS — E782 Mixed hyperlipidemia: Secondary | ICD-10-CM | POA: Diagnosis not present

## 2020-06-15 DIAGNOSIS — G4733 Obstructive sleep apnea (adult) (pediatric): Secondary | ICD-10-CM | POA: Diagnosis not present

## 2020-07-13 DIAGNOSIS — M170 Bilateral primary osteoarthritis of knee: Secondary | ICD-10-CM | POA: Diagnosis not present

## 2020-07-13 DIAGNOSIS — E039 Hypothyroidism, unspecified: Secondary | ICD-10-CM | POA: Diagnosis not present

## 2020-07-13 DIAGNOSIS — I10 Essential (primary) hypertension: Secondary | ICD-10-CM | POA: Diagnosis not present

## 2020-07-13 DIAGNOSIS — E782 Mixed hyperlipidemia: Secondary | ICD-10-CM | POA: Diagnosis not present

## 2020-07-13 DIAGNOSIS — E1169 Type 2 diabetes mellitus with other specified complication: Secondary | ICD-10-CM | POA: Diagnosis not present

## 2020-07-13 DIAGNOSIS — M179 Osteoarthritis of knee, unspecified: Secondary | ICD-10-CM | POA: Diagnosis not present

## 2020-07-13 DIAGNOSIS — E1165 Type 2 diabetes mellitus with hyperglycemia: Secondary | ICD-10-CM | POA: Diagnosis not present

## 2020-07-13 DIAGNOSIS — E1149 Type 2 diabetes mellitus with other diabetic neurological complication: Secondary | ICD-10-CM | POA: Diagnosis not present

## 2020-07-15 DIAGNOSIS — G4733 Obstructive sleep apnea (adult) (pediatric): Secondary | ICD-10-CM | POA: Diagnosis not present

## 2020-07-28 DIAGNOSIS — H35033 Hypertensive retinopathy, bilateral: Secondary | ICD-10-CM | POA: Diagnosis not present

## 2020-07-28 DIAGNOSIS — H25813 Combined forms of age-related cataract, bilateral: Secondary | ICD-10-CM | POA: Diagnosis not present

## 2020-07-28 DIAGNOSIS — E119 Type 2 diabetes mellitus without complications: Secondary | ICD-10-CM | POA: Diagnosis not present

## 2020-08-05 DIAGNOSIS — M170 Bilateral primary osteoarthritis of knee: Secondary | ICD-10-CM | POA: Diagnosis not present

## 2020-08-05 DIAGNOSIS — Z0000 Encounter for general adult medical examination without abnormal findings: Secondary | ICD-10-CM | POA: Diagnosis not present

## 2020-08-05 DIAGNOSIS — E1169 Type 2 diabetes mellitus with other specified complication: Secondary | ICD-10-CM | POA: Diagnosis not present

## 2020-08-05 DIAGNOSIS — E039 Hypothyroidism, unspecified: Secondary | ICD-10-CM | POA: Diagnosis not present

## 2020-08-05 DIAGNOSIS — G4733 Obstructive sleep apnea (adult) (pediatric): Secondary | ICD-10-CM | POA: Diagnosis not present

## 2020-08-05 DIAGNOSIS — I10 Essential (primary) hypertension: Secondary | ICD-10-CM | POA: Diagnosis not present

## 2020-08-05 DIAGNOSIS — M109 Gout, unspecified: Secondary | ICD-10-CM | POA: Diagnosis not present

## 2020-08-05 DIAGNOSIS — E782 Mixed hyperlipidemia: Secondary | ICD-10-CM | POA: Diagnosis not present

## 2020-08-05 DIAGNOSIS — G629 Polyneuropathy, unspecified: Secondary | ICD-10-CM | POA: Diagnosis not present

## 2020-08-06 ENCOUNTER — Other Ambulatory Visit: Payer: Self-pay | Admitting: Family Medicine

## 2020-08-06 DIAGNOSIS — Z136 Encounter for screening for cardiovascular disorders: Secondary | ICD-10-CM

## 2020-08-12 DIAGNOSIS — E1149 Type 2 diabetes mellitus with other diabetic neurological complication: Secondary | ICD-10-CM | POA: Diagnosis not present

## 2020-08-12 DIAGNOSIS — J4520 Mild intermittent asthma, uncomplicated: Secondary | ICD-10-CM | POA: Diagnosis not present

## 2020-08-12 DIAGNOSIS — E782 Mixed hyperlipidemia: Secondary | ICD-10-CM | POA: Diagnosis not present

## 2020-08-12 DIAGNOSIS — E039 Hypothyroidism, unspecified: Secondary | ICD-10-CM | POA: Diagnosis not present

## 2020-08-12 DIAGNOSIS — E1165 Type 2 diabetes mellitus with hyperglycemia: Secondary | ICD-10-CM | POA: Diagnosis not present

## 2020-08-12 DIAGNOSIS — D509 Iron deficiency anemia, unspecified: Secondary | ICD-10-CM | POA: Diagnosis not present

## 2020-08-12 DIAGNOSIS — M170 Bilateral primary osteoarthritis of knee: Secondary | ICD-10-CM | POA: Diagnosis not present

## 2020-08-12 DIAGNOSIS — I10 Essential (primary) hypertension: Secondary | ICD-10-CM | POA: Diagnosis not present

## 2020-08-12 DIAGNOSIS — E1169 Type 2 diabetes mellitus with other specified complication: Secondary | ICD-10-CM | POA: Diagnosis not present

## 2020-08-15 DIAGNOSIS — G4733 Obstructive sleep apnea (adult) (pediatric): Secondary | ICD-10-CM | POA: Diagnosis not present

## 2020-08-27 ENCOUNTER — Ambulatory Visit: Payer: Medicare Other

## 2020-08-31 ENCOUNTER — Ambulatory Visit
Admission: RE | Admit: 2020-08-31 | Discharge: 2020-08-31 | Disposition: A | Discharge status: Home / Self Care | Payer: Medicare Other | Source: Ambulatory Visit | Attending: Family Medicine | Admitting: Family Medicine

## 2020-08-31 DIAGNOSIS — Z136 Encounter for screening for cardiovascular disorders: Secondary | ICD-10-CM | POA: Diagnosis not present

## 2020-08-31 DIAGNOSIS — Z87891 Personal history of nicotine dependence: Secondary | ICD-10-CM | POA: Diagnosis not present

## 2020-09-02 DIAGNOSIS — E1149 Type 2 diabetes mellitus with other diabetic neurological complication: Secondary | ICD-10-CM | POA: Diagnosis not present

## 2020-09-02 DIAGNOSIS — M170 Bilateral primary osteoarthritis of knee: Secondary | ICD-10-CM | POA: Diagnosis not present

## 2020-09-02 DIAGNOSIS — I10 Essential (primary) hypertension: Secondary | ICD-10-CM | POA: Diagnosis not present

## 2020-09-02 DIAGNOSIS — E782 Mixed hyperlipidemia: Secondary | ICD-10-CM | POA: Diagnosis not present

## 2020-09-02 DIAGNOSIS — J4520 Mild intermittent asthma, uncomplicated: Secondary | ICD-10-CM | POA: Diagnosis not present

## 2020-09-02 DIAGNOSIS — E1169 Type 2 diabetes mellitus with other specified complication: Secondary | ICD-10-CM | POA: Diagnosis not present

## 2020-09-02 DIAGNOSIS — H35033 Hypertensive retinopathy, bilateral: Secondary | ICD-10-CM | POA: Diagnosis not present

## 2020-09-02 DIAGNOSIS — E1165 Type 2 diabetes mellitus with hyperglycemia: Secondary | ICD-10-CM | POA: Diagnosis not present

## 2020-09-02 DIAGNOSIS — D509 Iron deficiency anemia, unspecified: Secondary | ICD-10-CM | POA: Diagnosis not present

## 2020-09-09 DIAGNOSIS — G4733 Obstructive sleep apnea (adult) (pediatric): Secondary | ICD-10-CM | POA: Diagnosis not present

## 2020-09-14 DIAGNOSIS — G4733 Obstructive sleep apnea (adult) (pediatric): Secondary | ICD-10-CM | POA: Diagnosis not present

## 2020-09-14 DIAGNOSIS — D509 Iron deficiency anemia, unspecified: Secondary | ICD-10-CM | POA: Diagnosis not present

## 2020-09-29 DIAGNOSIS — D509 Iron deficiency anemia, unspecified: Secondary | ICD-10-CM | POA: Diagnosis not present

## 2020-09-29 DIAGNOSIS — I10 Essential (primary) hypertension: Secondary | ICD-10-CM | POA: Diagnosis not present

## 2020-09-29 DIAGNOSIS — H35033 Hypertensive retinopathy, bilateral: Secondary | ICD-10-CM | POA: Diagnosis not present

## 2020-09-29 DIAGNOSIS — E039 Hypothyroidism, unspecified: Secondary | ICD-10-CM | POA: Diagnosis not present

## 2020-09-29 DIAGNOSIS — E1169 Type 2 diabetes mellitus with other specified complication: Secondary | ICD-10-CM | POA: Diagnosis not present

## 2020-09-29 DIAGNOSIS — E1165 Type 2 diabetes mellitus with hyperglycemia: Secondary | ICD-10-CM | POA: Diagnosis not present

## 2020-09-29 DIAGNOSIS — M170 Bilateral primary osteoarthritis of knee: Secondary | ICD-10-CM | POA: Diagnosis not present

## 2020-09-29 DIAGNOSIS — E1149 Type 2 diabetes mellitus with other diabetic neurological complication: Secondary | ICD-10-CM | POA: Diagnosis not present

## 2020-09-29 DIAGNOSIS — E782 Mixed hyperlipidemia: Secondary | ICD-10-CM | POA: Diagnosis not present

## 2020-09-29 DIAGNOSIS — J4520 Mild intermittent asthma, uncomplicated: Secondary | ICD-10-CM | POA: Diagnosis not present

## 2020-10-15 DIAGNOSIS — G4733 Obstructive sleep apnea (adult) (pediatric): Secondary | ICD-10-CM | POA: Diagnosis not present

## 2020-10-28 DIAGNOSIS — J4520 Mild intermittent asthma, uncomplicated: Secondary | ICD-10-CM | POA: Diagnosis not present

## 2020-10-28 DIAGNOSIS — E782 Mixed hyperlipidemia: Secondary | ICD-10-CM | POA: Diagnosis not present

## 2020-10-28 DIAGNOSIS — I10 Essential (primary) hypertension: Secondary | ICD-10-CM | POA: Diagnosis not present

## 2020-10-28 DIAGNOSIS — E039 Hypothyroidism, unspecified: Secondary | ICD-10-CM | POA: Diagnosis not present

## 2020-10-28 DIAGNOSIS — E1149 Type 2 diabetes mellitus with other diabetic neurological complication: Secondary | ICD-10-CM | POA: Diagnosis not present

## 2020-10-28 DIAGNOSIS — E1165 Type 2 diabetes mellitus with hyperglycemia: Secondary | ICD-10-CM | POA: Diagnosis not present

## 2020-10-28 DIAGNOSIS — H35033 Hypertensive retinopathy, bilateral: Secondary | ICD-10-CM | POA: Diagnosis not present

## 2020-10-28 DIAGNOSIS — E1169 Type 2 diabetes mellitus with other specified complication: Secondary | ICD-10-CM | POA: Diagnosis not present

## 2020-11-15 DIAGNOSIS — G4733 Obstructive sleep apnea (adult) (pediatric): Secondary | ICD-10-CM | POA: Diagnosis not present

## 2020-11-17 DIAGNOSIS — R3912 Poor urinary stream: Secondary | ICD-10-CM | POA: Diagnosis not present

## 2020-11-17 DIAGNOSIS — R3911 Hesitancy of micturition: Secondary | ICD-10-CM | POA: Diagnosis not present

## 2020-11-29 DIAGNOSIS — E1169 Type 2 diabetes mellitus with other specified complication: Secondary | ICD-10-CM | POA: Diagnosis not present

## 2020-11-29 DIAGNOSIS — E782 Mixed hyperlipidemia: Secondary | ICD-10-CM | POA: Diagnosis not present

## 2020-11-29 DIAGNOSIS — E1149 Type 2 diabetes mellitus with other diabetic neurological complication: Secondary | ICD-10-CM | POA: Diagnosis not present

## 2020-11-29 DIAGNOSIS — E039 Hypothyroidism, unspecified: Secondary | ICD-10-CM | POA: Diagnosis not present

## 2020-11-29 DIAGNOSIS — J4520 Mild intermittent asthma, uncomplicated: Secondary | ICD-10-CM | POA: Diagnosis not present

## 2020-11-29 DIAGNOSIS — M170 Bilateral primary osteoarthritis of knee: Secondary | ICD-10-CM | POA: Diagnosis not present

## 2020-11-29 DIAGNOSIS — I10 Essential (primary) hypertension: Secondary | ICD-10-CM | POA: Diagnosis not present

## 2020-11-29 DIAGNOSIS — E1165 Type 2 diabetes mellitus with hyperglycemia: Secondary | ICD-10-CM | POA: Diagnosis not present

## 2020-11-29 DIAGNOSIS — H35033 Hypertensive retinopathy, bilateral: Secondary | ICD-10-CM | POA: Diagnosis not present

## 2020-12-15 DIAGNOSIS — G4733 Obstructive sleep apnea (adult) (pediatric): Secondary | ICD-10-CM | POA: Diagnosis not present

## 2020-12-27 DIAGNOSIS — E782 Mixed hyperlipidemia: Secondary | ICD-10-CM | POA: Diagnosis not present

## 2020-12-27 DIAGNOSIS — E1165 Type 2 diabetes mellitus with hyperglycemia: Secondary | ICD-10-CM | POA: Diagnosis not present

## 2020-12-27 DIAGNOSIS — E1149 Type 2 diabetes mellitus with other diabetic neurological complication: Secondary | ICD-10-CM | POA: Diagnosis not present

## 2020-12-27 DIAGNOSIS — E039 Hypothyroidism, unspecified: Secondary | ICD-10-CM | POA: Diagnosis not present

## 2020-12-27 DIAGNOSIS — M170 Bilateral primary osteoarthritis of knee: Secondary | ICD-10-CM | POA: Diagnosis not present

## 2020-12-27 DIAGNOSIS — M179 Osteoarthritis of knee, unspecified: Secondary | ICD-10-CM | POA: Diagnosis not present

## 2020-12-27 DIAGNOSIS — I10 Essential (primary) hypertension: Secondary | ICD-10-CM | POA: Diagnosis not present

## 2020-12-27 DIAGNOSIS — E1169 Type 2 diabetes mellitus with other specified complication: Secondary | ICD-10-CM | POA: Diagnosis not present

## 2020-12-27 DIAGNOSIS — J4520 Mild intermittent asthma, uncomplicated: Secondary | ICD-10-CM | POA: Diagnosis not present

## 2021-01-10 ENCOUNTER — Other Ambulatory Visit (HOSPITAL_COMMUNITY): Payer: Self-pay | Admitting: Urology

## 2021-01-10 DIAGNOSIS — C61 Malignant neoplasm of prostate: Secondary | ICD-10-CM

## 2021-01-15 DIAGNOSIS — G4733 Obstructive sleep apnea (adult) (pediatric): Secondary | ICD-10-CM | POA: Diagnosis not present

## 2021-01-24 ENCOUNTER — Other Ambulatory Visit: Payer: Self-pay

## 2021-01-24 ENCOUNTER — Encounter (HOSPITAL_COMMUNITY)
Admission: RE | Admit: 2021-01-24 | Discharge: 2021-01-24 | Disposition: A | Discharge status: Home / Self Care | Payer: Medicare Other | Source: Ambulatory Visit | Attending: Urology | Admitting: Urology

## 2021-01-24 DIAGNOSIS — C61 Malignant neoplasm of prostate: Secondary | ICD-10-CM | POA: Diagnosis present

## 2021-01-24 DIAGNOSIS — I700 Atherosclerosis of aorta: Secondary | ICD-10-CM | POA: Diagnosis not present

## 2021-01-24 MED ORDER — TECHNETIUM TC 99M MEDRONATE IV KIT
20.0000 | PACK | Freq: Once | INTRAVENOUS | Status: AC | PRN
  Administered 2021-01-24: 20.6 via INTRAVENOUS

## 2021-01-26 DIAGNOSIS — M170 Bilateral primary osteoarthritis of knee: Secondary | ICD-10-CM | POA: Diagnosis not present

## 2021-01-26 DIAGNOSIS — E1165 Type 2 diabetes mellitus with hyperglycemia: Secondary | ICD-10-CM | POA: Diagnosis not present

## 2021-01-26 DIAGNOSIS — I10 Essential (primary) hypertension: Secondary | ICD-10-CM | POA: Diagnosis not present

## 2021-01-26 DIAGNOSIS — E1149 Type 2 diabetes mellitus with other diabetic neurological complication: Secondary | ICD-10-CM | POA: Diagnosis not present

## 2021-01-26 DIAGNOSIS — E1169 Type 2 diabetes mellitus with other specified complication: Secondary | ICD-10-CM | POA: Diagnosis not present

## 2021-01-26 DIAGNOSIS — J4520 Mild intermittent asthma, uncomplicated: Secondary | ICD-10-CM | POA: Diagnosis not present

## 2021-01-26 DIAGNOSIS — E039 Hypothyroidism, unspecified: Secondary | ICD-10-CM | POA: Diagnosis not present

## 2021-01-26 DIAGNOSIS — E782 Mixed hyperlipidemia: Secondary | ICD-10-CM | POA: Diagnosis not present

## 2021-02-04 DIAGNOSIS — E349 Endocrine disorder, unspecified: Secondary | ICD-10-CM | POA: Diagnosis not present

## 2021-02-04 DIAGNOSIS — R3912 Poor urinary stream: Secondary | ICD-10-CM | POA: Diagnosis not present

## 2021-02-14 DIAGNOSIS — G4733 Obstructive sleep apnea (adult) (pediatric): Secondary | ICD-10-CM | POA: Diagnosis not present

## 2021-02-23 NOTE — Progress Notes (Incomplete)
GU Location of Tumor / Histology: Prostate Ca  If Prostate Cancer, Gleason Score is (4 + 3) and PSA is (4.67 as of 9/22)  Biopsies: Dr. Jeffie Pollock        Past/Anticipated interventions by urology, if any:   Weight changes, if any:   IPSS: SHIM:  Bowel/Bladder complaints, if any:    Nausea/Vomiting, if any:   Pain issues, if any:    SAFETY ISSUES: Prior radiation?  Pacemaker/ICD?  Possible current pregnancy?   Male Is the patient on methotrexate?   Current Complaints / other details:

## 2021-02-24 DIAGNOSIS — E782 Mixed hyperlipidemia: Secondary | ICD-10-CM | POA: Diagnosis not present

## 2021-02-24 DIAGNOSIS — E1169 Type 2 diabetes mellitus with other specified complication: Secondary | ICD-10-CM | POA: Diagnosis not present

## 2021-02-24 DIAGNOSIS — E559 Vitamin D deficiency, unspecified: Secondary | ICD-10-CM | POA: Diagnosis not present

## 2021-02-24 DIAGNOSIS — Z23 Encounter for immunization: Secondary | ICD-10-CM | POA: Diagnosis not present

## 2021-02-24 DIAGNOSIS — M109 Gout, unspecified: Secondary | ICD-10-CM | POA: Diagnosis not present

## 2021-02-24 DIAGNOSIS — I10 Essential (primary) hypertension: Secondary | ICD-10-CM | POA: Diagnosis not present

## 2021-02-24 DIAGNOSIS — J4520 Mild intermittent asthma, uncomplicated: Secondary | ICD-10-CM | POA: Diagnosis not present

## 2021-02-24 DIAGNOSIS — G629 Polyneuropathy, unspecified: Secondary | ICD-10-CM | POA: Diagnosis not present

## 2021-02-24 DIAGNOSIS — E039 Hypothyroidism, unspecified: Secondary | ICD-10-CM | POA: Diagnosis not present

## 2021-02-25 ENCOUNTER — Ambulatory Visit
Admission: RE | Admit: 2021-02-25 | Discharge: 2021-02-25 | Disposition: A | Discharge status: Home / Self Care | Payer: Commercial Managed Care - HMO | Source: Ambulatory Visit | Attending: Radiation Oncology | Admitting: Radiation Oncology

## 2021-02-25 ENCOUNTER — Ambulatory Visit: Payer: Commercial Managed Care - HMO | Admitting: Radiation Oncology

## 2021-02-25 ENCOUNTER — Ambulatory Visit: Payer: Commercial Managed Care - HMO

## 2021-02-25 ENCOUNTER — Ambulatory Visit: Admission: RE | Admit: 2021-02-25 | Payer: Commercial Managed Care - HMO | Source: Ambulatory Visit

## 2021-02-25 VITALS — Ht 69.0 in | Wt 375.0 lb

## 2021-02-25 DIAGNOSIS — E039 Hypothyroidism, unspecified: Secondary | ICD-10-CM | POA: Insufficient documentation

## 2021-02-25 DIAGNOSIS — F331 Major depressive disorder, recurrent, moderate: Secondary | ICD-10-CM | POA: Insufficient documentation

## 2021-02-25 DIAGNOSIS — C61 Malignant neoplasm of prostate: Secondary | ICD-10-CM

## 2021-02-25 DIAGNOSIS — L309 Dermatitis, unspecified: Secondary | ICD-10-CM | POA: Insufficient documentation

## 2021-02-25 DIAGNOSIS — M25569 Pain in unspecified knee: Secondary | ICD-10-CM | POA: Insufficient documentation

## 2021-02-25 DIAGNOSIS — R399 Unspecified symptoms and signs involving the genitourinary system: Secondary | ICD-10-CM | POA: Insufficient documentation

## 2021-02-25 DIAGNOSIS — M109 Gout, unspecified: Secondary | ICD-10-CM | POA: Insufficient documentation

## 2021-02-25 DIAGNOSIS — R262 Difficulty in walking, not elsewhere classified: Secondary | ICD-10-CM | POA: Insufficient documentation

## 2021-02-25 DIAGNOSIS — J4520 Mild intermittent asthma, uncomplicated: Secondary | ICD-10-CM | POA: Insufficient documentation

## 2021-02-25 DIAGNOSIS — E559 Vitamin D deficiency, unspecified: Secondary | ICD-10-CM | POA: Insufficient documentation

## 2021-02-25 DIAGNOSIS — N400 Enlarged prostate without lower urinary tract symptoms: Secondary | ICD-10-CM | POA: Insufficient documentation

## 2021-02-25 DIAGNOSIS — F324 Major depressive disorder, single episode, in partial remission: Secondary | ICD-10-CM | POA: Insufficient documentation

## 2021-02-25 DIAGNOSIS — E1165 Type 2 diabetes mellitus with hyperglycemia: Secondary | ICD-10-CM | POA: Insufficient documentation

## 2021-02-25 DIAGNOSIS — M179 Osteoarthritis of knee, unspecified: Secondary | ICD-10-CM | POA: Insufficient documentation

## 2021-02-25 DIAGNOSIS — H35039 Hypertensive retinopathy, unspecified eye: Secondary | ICD-10-CM | POA: Insufficient documentation

## 2021-02-25 DIAGNOSIS — G629 Polyneuropathy, unspecified: Secondary | ICD-10-CM | POA: Insufficient documentation

## 2021-02-25 DIAGNOSIS — Z9181 History of falling: Secondary | ICD-10-CM | POA: Insufficient documentation

## 2021-02-25 DIAGNOSIS — N3000 Acute cystitis without hematuria: Secondary | ICD-10-CM | POA: Insufficient documentation

## 2021-02-25 DIAGNOSIS — R209 Unspecified disturbances of skin sensation: Secondary | ICD-10-CM | POA: Insufficient documentation

## 2021-02-25 DIAGNOSIS — D509 Iron deficiency anemia, unspecified: Secondary | ICD-10-CM | POA: Insufficient documentation

## 2021-02-25 NOTE — Progress Notes (Signed)
GU Location of Tumor / Histology: Prostate Ca  If Prostate Cancer, Gleason Score is (4 + 3) and PSA is (4.67 as of 9/22)  Biopsies  Dr. Jeffie Pollock         Past/Anticipated interventions by urology, if any:   Past/Anticipated interventions by medical oncology, if any:   Weight changes, if any: No  IPSS:  16 SHIM:  11  Bowel/Bladder complaints, if any:  No bowel or bladder.  Nausea/Vomiting, if any:  No  Pain issues, if any:  9/10 Bilateral knees.  SAFETY ISSUES: Prior radiation?  No Pacemaker/ICD?  No Possible current pregnancy?   Male Is the patient on methotrexate? No  Current Complaints / other details:  More information on treatment.

## 2021-02-25 NOTE — Progress Notes (Signed)
Radiation Oncology         [phone] ________________________________  Initial Outpatient Consultation - Conducted via Telephone due to current COVID-19 concerns for limiting patient exposure  Name: Cameron Moran MRN: [id number]  Date: 02/25/2021  DOB: 08/01/1950  [id number], Herbie [place], MD  Irine Seal, MD   REFERRING PHYSICIAN: Irine Seal, MD  DIAGNOSIS: 71 y.o. 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    ICD-10-CM   1. Malignant neoplasm of prostate ([place])  C61       HISTORY OF PRESENT ILLNESS: Cameron Moran is a 71 y.o. male with a diagnosis of prostate cancer. He was noted to have significant LUTS and an elevated PSA of 5.4 on 08/15/20 by his primary care physician, Dr. Alyson Ingles.  Repeat PSA was performed on 09/14/2020 after course of antibiotics and remained elevated at 5.5.  Accordingly, he was referred for evaluation in urology by Dr. Jeffie Pollock on 11/17/20,  digital rectal examination was performed at that time revealing a possible left apical nodule/induration although exam was noted to be limited secondary to patient's body habitus. Repeat PSA that day remained elevated at 4.67. The patient proceeded to transrectal ultrasound with 12 biopsies of the prostate on 01/05/21.  The prostate volume measured 22 cc.  Out of 12 core biopsies, 11 were positive.  The maximum Gleason score was 4+5, and this was seen in the left base and right mid. Additionally, Gleason 4+4 was seen in the right base lateral, Gleason 4+3 in the right base, left mid lateral, and left base lateral, Gleason 3+4 in the left apex lateral (with perineural invasion), left apex, and right mid lateral (two foci), and small foci of Gleason 3+3 in the right apex and left mid.  He underwent staging CT A/P on 01/24/21 showing no evidence of lymphadenopathy or visceral metastatic disease.  A bone scan performed the same day was also negative for osseous metastatic disease.  The  patient reviewed the biopsy results with his urologist and he has kindly been referred today for discussion of potential radiation treatment options.  The patient has previously had gastric bypass surgery and reports that his current weight is approximately 375 pounds, down from over 500 pounds.  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  There is some concern about the possibility for castrate resistant disease given the high risk adenocarcinoma in the setting of low T.   PREVIOUS RADIATION THERAPY: No  PAST MEDICAL HISTORY:  Past Medical History:  Diagnosis Date   Asthma    CAD (coronary artery disease)    Depressive disorder, not elsewhere classified    Diabetes mellitus    Gout, unspecified    Hyperlipidemia    Hypertension    Morbid obesity ([place])    Sleep apnea    Type II or unspecified type diabetes mellitus without mention of complication, uncontrolled       PAST SURGICAL HISTORY: Past Surgical History:  Procedure Laterality Date   KNEE SURGERY  1989-90   SLEEVE GASTROPLASTY  2007    FAMILY HISTORY:  Family History  Problem Relation Age of Onset   Cancer Other     SOCIAL HISTORY:  Social History   Socioeconomic History   Marital status: Married    Spouse name: Not on file   Number of children: Not on file   Years of education: Not on file   Highest education level: Not  on file  Occupational History   Not on file  Tobacco Use   Smoking status: Former    Types: Cigarettes    Quit date: 06/07/1994    Years since quitting: 26.7   Smokeless tobacco: Not on file  Substance and Sexual Activity   Alcohol use: No    Comment: Quit drinking in 1996   Drug use: Not on file   Sexual activity: Not on file  Other Topics Concern   Not on file  Social History Narrative   Not on file   Social Determinants of Health   Financial Resource Strain: Not on file  Food Insecurity: Not on file  Transportation Needs: Not  on file  Physical Activity: Not on file  Stress: Not on file  Social Connections: Not on file  Intimate Partner Violence: Not on file    ALLERGIES: Doxycycline calcium, Doxycycline calcium, and Penicillin v potassium  MEDICATIONS:  Current Outpatient Medications  Medication Sig Dispense Refill   allopurinol (ZYLOPRIM) 300 MG tablet Take 300 mg by mouth daily.     ALPRAZolam (XANAX) 0.25 MG tablet Take 0.5 mg by mouth 2 (two) times daily as needed for anxiety.      iron polysaccharides (FERREX 150) 150 MG capsule 2 capsules     albuterol (PROAIR HFA) 108 (90 Base) MCG/ACT inhaler 1-2 puffs     atorvastatin (LIPITOR) 40 MG tablet Take 1 tablet (40 mg total) by mouth daily. 90 tablet 3   Blood Glucose Monitoring Suppl (CONTOUR NEXT EZ) w/Device KIT See admin instructions.     buPROPion (WELLBUTRIN XL) 300 MG 24 hr tablet 1 tablet     citalopram (CELEXA) 20 MG tablet Take 20 mg by mouth daily.     gabapentin (NEURONTIN) 300 MG capsule Take 300 mg by mouth See admin instructions. Take 300 mg  In the morning and the afternoon 900 mg in the evening     HYDROcodone-acetaminophen (NORCO) 10-325 MG tablet Take 1 tablet by mouth every 6 (six) hours as needed.     Levothyroxine Sodium 112 MCG CAPS Take 224 mcg by mouth daily with breakfast.      lisinopril (PRINIVIL,ZESTRIL) 10 MG tablet Take 10 mg by mouth daily.     metFORMIN (GLUCOPHAGE) 1000 MG tablet Take 1,000 mg by mouth 2 (two) times daily with a meal.     Semaglutide, 1 MG/DOSE, (OZEMPIC, 1 MG/DOSE,) 2 MG/1.5ML SOPN 1 mg     tamsulosin (FLOMAX) 0.4 MG CAPS capsule 2 capsules     triamcinolone cream (KENALOG) 0.1 % Apply 1 application topically 2 (two) times daily.     No current facility-administered medications for this encounter.    REVIEW OF SYSTEMS:  On review of systems, the patient reports that he is doing well overall. He denies any chest pain, shortness of breath, cough, fevers, chills, night sweats, unintended weight changes.  He denies any bowel disturbances, and denies abdominal pain, nausea or vomiting. He denies any new musculoskeletal or joint aches or pains. His IPSS was 16, indicating moderate urinary symptoms with nocturia x4, weak flow of stream, frequency and feelings of incomplete bladder emptying. His SHIM was 11, indicating he has moderate-severe erectile dysfunction. A complete review of systems is obtained and is otherwise negative.    PHYSICAL EXAM:  Wt Readings from Last 3 Encounters:  02/25/21 (!) 375 lb (170.1 kg)  01/24/21 (!) 397 lb 13.1 oz (180.4 kg)  04/19/16 (!) 530 lb (240.4 kg)   Temp Readings from Last 3 Encounters:  08/15/18 (!) 97.5 F (36.4 C) (Axillary)  04/20/16 98.7 F (37.1 C) (Oral)   BP Readings from Last 3 Encounters:  08/15/18 136/63  04/20/16 132/84   Pulse Readings from Last 3 Encounters:  08/15/18 80  04/20/16 106   Pain Assessment Pain Score: 9  Pain Loc: Knee/10  Physical exam not performed in light of telephone consult visit format .   KPS = 90  100 - Normal; no complaints; no evidence of disease. 90   - Able to carry on normal activity; minor signs or symptoms of disease. 80   - Normal activity with effort; some signs or symptoms of disease. 46   - Cares for self; unable to carry on normal activity or to do active work. 60   - Requires occasional assistance, but is able to care for most of his personal needs. 50   - Requires considerable assistance and frequent medical care. 26   - Disabled; requires special care and assistance. 51   - Severely disabled; hospital admission is indicated although death not imminent. 63   - Very sick; hospital admission necessary; active supportive treatment necessary. 10   - Moribund; fatal processes progressing rapidly. 0     - Dead  Karnofsky DA, Abelmann Taylor, Craver LS and Burchenal [facility] [phone]) The use of the nitrogen mustards in the palliative treatment of carcinoma: with particular reference to bronchogenic carcinoma  Cancer 1 [phone]  LABORATORY DATA:  Lab Results  Component Value Date   WBC 5.4 08/15/2018   HGB 10.6 (L) 08/15/2018   HCT 32.1 (L) 08/15/2018   MCV 97.6 08/15/2018   PLT 193 08/15/2018   Lab Results  Component Value Date   NA 141 08/15/2018   K 4.9 08/15/2018   CL 112 (H) 08/15/2018   CO2 19 (L) 08/15/2018   Lab Results  Component Value Date   ALT 15 08/14/2018   AST 23 08/14/2018   ALKPHOS 37 (L) 08/14/2018   BILITOT 1.0 08/14/2018     RADIOGRAPHY: No results found.    IMPRESSION/PLAN: This visit was conducted via Telephone to spare the patient unnecessary potential exposure in the healthcare setting during the current COVID-19 pandemic. 1. 71 y.o. gentleman with Stage T2a adenocarcinoma of the prostate with Gleason Score of 4+5, and PSA of 4.67. We discussed the patient's workup and outlined the nature of prostate cancer in this setting. The patient's T stage, Gleason's score, and PSA put him into the high risk group. Accordingly, he is eligible for a variety of potential treatment options including prostatectomy or LT-ADT in combination with either 8 weeks of external radiation or 5 weeks of external radiation preceded by a brachytherapy boost. We discussed the available radiation techniques, and focused on the details and logistics and delivery.  He is not an ideal surgical candidate given his morbid obesity and medical comorbidities and is also not a brachytherapy candidate given the small volume of his prostate prior to ADT.  Therefore, we discussed and outlined the risks, benefits, short and long-term effects associated with external beam radiotherapy and compared and contrasted these with prostatectomy. We discussed the role of SpaceOAR in reducing the rectal toxicity associated with radiotherapy. We also detailed the role of LT-ADT in the treatment of high risk prostate cancer and outlined the associated side effects that could be expected with this therapy. He was encouraged  to ask questions that were answered to his stated satisfaction.  At the end of the conversation, the patient is interested in moving  forward with 8 weeks of external beam therapy in combination with ADT. He has not received his first Eligard injection but Dr. Jeffie Pollock has already started the prior authorization process. We will share our discussion with Dr. Jeffie Pollock and make arrangements for start of ADT now and we will also coordinate for fiducial markers and SpaceOAR gel placement in March 2023, prior to simulation, to reduce rectal toxicity from radiotherapy. The patient appears to have a good understanding of his disease and our treatment recommendations which are of curative intent and is in agreement with the stated plan.  Therefore, we will move forward with treatment planning accordingly, in anticipation of beginning IMRT in March 2023, approximately 2 months after starting ADT.  We enjoyed meeting him today and look forward to continuing to participate in his care.  Given current concerns for patient exposure during the COVID-19 pandemic, this encounter was conducted via telephone. The patient was notified in advance and was offered a MyChart meeting to allow for face to face communication but unfortunately reported that he did not have the appropriate resources/technology to support such a visit and instead preferred to proceed with telephone consult. The patient has given verbal consent for this type of encounter. The attendants for this meeting include Tyler Pita MD, Ashlyn Bruning PA-C, and patient, Cameron Moran. During the encounter, Tyler Pita MD and Freeman Caldron PA-C were located at [facility].  Patient, Cameron Moran was located at home.   We personally spent 70 minutes in this encounter including chart review, reviewing radiological studies, meeting face-to-face with the patient, entering orders and completing  documentation.    Nicholos Johns, PA-C    Tyler Pita, MD  [facility] Direct Dial: [phone]   Fax: [phone] [email or web address]   Skype   [email or web address]   This document serves as a record of services personally performed by Tyler Pita, MD and Freeman Caldron, PA-C. It was created on their behalf by Wilburn Mylar, a trained medical scribe. The creation of this record is based on the scribe's personal observations and the provider's statements to them. This document has been checked and approved by the attending provider.

## 2021-02-28 DIAGNOSIS — C61 Malignant neoplasm of prostate: Secondary | ICD-10-CM | POA: Insufficient documentation

## 2021-03-01 ENCOUNTER — Telehealth: Payer: Self-pay | Admitting: *Deleted

## 2021-03-01 NOTE — Telephone Encounter (Signed)
CALLED PATIENT TO INFORM OF APPT. WITH DR. Junious Silk ON 04-01-21 - ARRIVAL TIME- 9:45 AM FOR LAB AND THEN TO SEE DR. Junious Silk REGARDING ADT, SPOKE WITH PATIENT'S WIFE- KAREN AND SHE IS AWARE OF THIS APPT.

## 2021-03-01 NOTE — Telephone Encounter (Signed)
CALLED PATIENT TO INFORM OF AN APPT. WITH DR. Junious Silk ON 03-23-21- ARRIVAL TIME- 9:45 AM FOR ADT, LVM FOR A RETURN CALL

## 2021-03-01 NOTE — Progress Notes (Signed)
Called and introduced myself to the patient as the prostate nurse navigator.  Patient had a telephone consult with Ashlyn, PA-C and Dr. Tammi Klippel on 02/25/2021 and has decided to proceed with ADT followed by 8 weeks of EBRT.   Patient is set up for ADT on 03/23/2021, and patient is aware.  Contact information provided to patient.  I encouraged patient to call with any questions/concerns/or barriers that may arise.  Verbalized understanding and agreement.

## 2021-03-05 DIAGNOSIS — Z191 Hormone sensitive malignancy status: Secondary | ICD-10-CM | POA: Diagnosis not present

## 2021-03-09 ENCOUNTER — Inpatient Hospital Stay (HOSPITAL_COMMUNITY)
Admission: EM | Admit: 2021-03-09 | Discharge: 2021-03-11 | DRG: 683 | Disposition: A | Discharge status: Home Health | Payer: Medicare Other | Attending: Internal Medicine | Admitting: Internal Medicine

## 2021-03-09 ENCOUNTER — Emergency Department (HOSPITAL_COMMUNITY): Payer: Medicare Other

## 2021-03-09 ENCOUNTER — Other Ambulatory Visit: Payer: Self-pay

## 2021-03-09 DIAGNOSIS — Z6841 Body Mass Index (BMI) 40.0 and over, adult: Secondary | ICD-10-CM

## 2021-03-09 DIAGNOSIS — E860 Dehydration: Secondary | ICD-10-CM | POA: Diagnosis present

## 2021-03-09 DIAGNOSIS — I10 Essential (primary) hypertension: Secondary | ICD-10-CM | POA: Diagnosis not present

## 2021-03-09 DIAGNOSIS — I2510 Atherosclerotic heart disease of native coronary artery without angina pectoris: Secondary | ICD-10-CM | POA: Diagnosis present

## 2021-03-09 DIAGNOSIS — R531 Weakness: Secondary | ICD-10-CM | POA: Diagnosis not present

## 2021-03-09 DIAGNOSIS — Z87891 Personal history of nicotine dependence: Secondary | ICD-10-CM | POA: Diagnosis not present

## 2021-03-09 DIAGNOSIS — Z9181 History of falling: Secondary | ICD-10-CM | POA: Diagnosis not present

## 2021-03-09 DIAGNOSIS — N179 Acute kidney failure, unspecified: Principal | ICD-10-CM

## 2021-03-09 DIAGNOSIS — Z79899 Other long term (current) drug therapy: Secondary | ICD-10-CM | POA: Diagnosis not present

## 2021-03-09 DIAGNOSIS — E039 Hypothyroidism, unspecified: Secondary | ICD-10-CM | POA: Diagnosis not present

## 2021-03-09 DIAGNOSIS — R627 Adult failure to thrive: Secondary | ICD-10-CM | POA: Diagnosis present

## 2021-03-09 DIAGNOSIS — I517 Cardiomegaly: Secondary | ICD-10-CM | POA: Diagnosis not present

## 2021-03-09 DIAGNOSIS — F32A Depression, unspecified: Secondary | ICD-10-CM | POA: Diagnosis present

## 2021-03-09 DIAGNOSIS — M109 Gout, unspecified: Secondary | ICD-10-CM | POA: Diagnosis present

## 2021-03-09 DIAGNOSIS — J45909 Unspecified asthma, uncomplicated: Secondary | ICD-10-CM | POA: Diagnosis not present

## 2021-03-09 DIAGNOSIS — G4733 Obstructive sleep apnea (adult) (pediatric): Secondary | ICD-10-CM | POA: Diagnosis not present

## 2021-03-09 DIAGNOSIS — R262 Difficulty in walking, not elsewhere classified: Secondary | ICD-10-CM | POA: Diagnosis present

## 2021-03-09 DIAGNOSIS — E119 Type 2 diabetes mellitus without complications: Secondary | ICD-10-CM | POA: Diagnosis present

## 2021-03-09 DIAGNOSIS — Z79890 Hormone replacement therapy (postmenopausal): Secondary | ICD-10-CM | POA: Diagnosis not present

## 2021-03-09 DIAGNOSIS — Z743 Need for continuous supervision: Secondary | ICD-10-CM | POA: Diagnosis not present

## 2021-03-09 DIAGNOSIS — Z881 Allergy status to other antibiotic agents status: Secondary | ICD-10-CM

## 2021-03-09 DIAGNOSIS — Z7985 Long-term (current) use of injectable non-insulin antidiabetic drugs: Secondary | ICD-10-CM

## 2021-03-09 DIAGNOSIS — Z20822 Contact with and (suspected) exposure to covid-19: Secondary | ICD-10-CM | POA: Diagnosis present

## 2021-03-09 DIAGNOSIS — C61 Malignant neoplasm of prostate: Secondary | ICD-10-CM | POA: Diagnosis present

## 2021-03-09 DIAGNOSIS — G319 Degenerative disease of nervous system, unspecified: Secondary | ICD-10-CM | POA: Diagnosis not present

## 2021-03-09 DIAGNOSIS — E875 Hyperkalemia: Secondary | ICD-10-CM | POA: Diagnosis not present

## 2021-03-09 DIAGNOSIS — R296 Repeated falls: Secondary | ICD-10-CM | POA: Diagnosis not present

## 2021-03-09 DIAGNOSIS — M542 Cervicalgia: Secondary | ICD-10-CM | POA: Diagnosis not present

## 2021-03-09 DIAGNOSIS — Z7984 Long term (current) use of oral hypoglycemic drugs: Secondary | ICD-10-CM | POA: Diagnosis not present

## 2021-03-09 DIAGNOSIS — E785 Hyperlipidemia, unspecified: Secondary | ICD-10-CM | POA: Diagnosis not present

## 2021-03-09 DIAGNOSIS — R0902 Hypoxemia: Secondary | ICD-10-CM | POA: Diagnosis not present

## 2021-03-09 DIAGNOSIS — S80919A Unspecified superficial injury of unspecified knee, initial encounter: Secondary | ICD-10-CM | POA: Diagnosis not present

## 2021-03-09 DIAGNOSIS — R9431 Abnormal electrocardiogram [ECG] [EKG]: Secondary | ICD-10-CM | POA: Diagnosis not present

## 2021-03-09 DIAGNOSIS — R6889 Other general symptoms and signs: Secondary | ICD-10-CM | POA: Diagnosis not present

## 2021-03-09 LAB — CBC WITH DIFFERENTIAL/PLATELET
Abs Immature Granulocytes: 0.01 10*3/uL (ref 0.00–0.07)
Basophils Absolute: 0 10*3/uL (ref 0.0–0.1)
Basophils Relative: 1 %
Eosinophils Absolute: 0.2 10*3/uL (ref 0.0–0.5)
Eosinophils Relative: 3 %
HCT: 34.6 % — ABNORMAL LOW (ref 39.0–52.0)
Hemoglobin: 10.9 g/dL — ABNORMAL LOW (ref 13.0–17.0)
Immature Granulocytes: 0 %
Lymphocytes Relative: 14 %
Lymphs Abs: 0.9 10*3/uL (ref 0.7–4.0)
MCH: 30.8 pg (ref 26.0–34.0)
MCHC: 31.5 g/dL (ref 30.0–36.0)
MCV: 97.7 fL (ref 80.0–100.0)
Monocytes Absolute: 0.5 10*3/uL (ref 0.1–1.0)
Monocytes Relative: 8 %
Neutro Abs: 4.8 10*3/uL (ref 1.7–7.7)
Neutrophils Relative %: 74 %
Platelets: 166 10*3/uL (ref 150–400)
RBC: 3.54 MIL/uL — ABNORMAL LOW (ref 4.22–5.81)
RDW: 13.7 % (ref 11.5–15.5)
WBC: 6.5 10*3/uL (ref 4.0–10.5)
nRBC: 0 % (ref 0.0–0.2)

## 2021-03-09 LAB — URINALYSIS, ROUTINE W REFLEX MICROSCOPIC
Bilirubin Urine: NEGATIVE
Glucose, UA: NEGATIVE mg/dL
Ketones, ur: NEGATIVE mg/dL
Leukocytes,Ua: NEGATIVE
Nitrite: NEGATIVE
Protein, ur: NEGATIVE mg/dL
Specific Gravity, Urine: 1.01 (ref 1.005–1.030)
pH: 5.5 (ref 5.0–8.0)

## 2021-03-09 LAB — RAPID URINE DRUG SCREEN, HOSP PERFORMED
Amphetamines: NOT DETECTED
Barbiturates: NOT DETECTED
Benzodiazepines: NOT DETECTED
Cocaine: NOT DETECTED
Opiates: POSITIVE — AB
Tetrahydrocannabinol: NOT DETECTED

## 2021-03-09 LAB — URINALYSIS, MICROSCOPIC (REFLEX)

## 2021-03-09 LAB — BASIC METABOLIC PANEL
Anion gap: 10 (ref 5–15)
BUN: 66 mg/dL — ABNORMAL HIGH (ref 8–23)
CO2: 18 mmol/L — ABNORMAL LOW (ref 22–32)
Calcium: 8.8 mg/dL — ABNORMAL LOW (ref 8.9–10.3)
Chloride: 102 mmol/L (ref 98–111)
Creatinine, Ser: 2.49 mg/dL — ABNORMAL HIGH (ref 0.61–1.24)
GFR, Estimated: 27 mL/min — ABNORMAL LOW (ref >60)
Glucose, Bld: 102 mg/dL — ABNORMAL HIGH (ref 70–99)
Potassium: 6.3 mmol/L (ref 3.5–5.1)
Sodium: 130 mmol/L — ABNORMAL LOW (ref 135–145)

## 2021-03-09 LAB — PROTIME-INR
INR: 1.1 (ref 0.8–1.2)
Prothrombin Time: 14.5 seconds (ref 11.4–15.2)

## 2021-03-09 LAB — COMPREHENSIVE METABOLIC PANEL
ALT: 20 U/L (ref 0–44)
AST: 37 U/L (ref 15–41)
Albumin: 3.5 g/dL (ref 3.5–5.0)
Alkaline Phosphatase: 47 U/L (ref 38–126)
Anion gap: 13 (ref 5–15)
BUN: 72 mg/dL — ABNORMAL HIGH (ref 8–23)
CO2: 20 mmol/L — ABNORMAL LOW (ref 22–32)
Calcium: 9.1 mg/dL (ref 8.9–10.3)
Chloride: 96 mmol/L — ABNORMAL LOW (ref 98–111)
Creatinine, Ser: 3.29 mg/dL — ABNORMAL HIGH (ref 0.61–1.24)
GFR, Estimated: 19 mL/min — ABNORMAL LOW (ref >60)
Glucose, Bld: 96 mg/dL (ref 70–99)
Potassium: 6.1 mmol/L — ABNORMAL HIGH (ref 3.5–5.1)
Sodium: 129 mmol/L — ABNORMAL LOW (ref 135–145)
Total Bilirubin: 0.7 mg/dL (ref 0.3–1.2)
Total Protein: 6.8 g/dL (ref 6.5–8.1)

## 2021-03-09 LAB — ETHANOL: Alcohol, Ethyl (B): <10 mg/dL (ref <10)

## 2021-03-09 LAB — CBG MONITORING, ED: Glucose-Capillary: 94 mg/dL (ref 70–99)

## 2021-03-09 LAB — GLUCOSE, CAPILLARY: Glucose-Capillary: 80 mg/dL (ref 70–99)

## 2021-03-09 LAB — RESP PANEL BY RT-PCR (FLU A&B, COVID) ARPGX2
Influenza A by PCR: NEGATIVE
Influenza B by PCR: NEGATIVE
SARS Coronavirus 2 by RT PCR: NEGATIVE

## 2021-03-09 MED ORDER — SODIUM ZIRCONIUM CYCLOSILICATE 10 G PO PACK
10.0000 g | PACK | Freq: Once | ORAL | Status: AC
  Administered 2021-03-09: 10 g via ORAL
  Filled 2021-03-09: qty 1

## 2021-03-09 MED ORDER — SODIUM CHLORIDE 0.9 % IV BOLUS
1000.0000 mL | Freq: Once | INTRAVENOUS | Status: AC
  Administered 2021-03-09: 1000 mL via INTRAVENOUS

## 2021-03-09 MED ORDER — ALBUTEROL SULFATE (2.5 MG/3ML) 0.083% IN NEBU
2.5000 mg | INHALATION_SOLUTION | Freq: Once | RESPIRATORY_TRACT | Status: AC
  Administered 2021-03-09: 2.5 mg via RESPIRATORY_TRACT
  Filled 2021-03-09: qty 3

## 2021-03-09 MED ORDER — DEXTROSE 50 % IV SOLN
1.0000 | Freq: Once | INTRAVENOUS | Status: AC
  Administered 2021-03-09: 50 mL via INTRAVENOUS
  Filled 2021-03-09: qty 50

## 2021-03-09 MED ORDER — CALCIUM GLUCONATE-NACL 1-0.675 GM/50ML-% IV SOLN
1.0000 g | Freq: Once | INTRAVENOUS | Status: AC
  Administered 2021-03-09: 1000 mg via INTRAVENOUS
  Filled 2021-03-09: qty 50

## 2021-03-09 MED ORDER — INSULIN ASPART 100 UNIT/ML IV SOLN
5.0000 [IU] | Freq: Once | INTRAVENOUS | Status: AC
  Administered 2021-03-09: 5 [IU] via INTRAVENOUS

## 2021-03-09 MED ORDER — INSULIN ASPART 100 UNIT/ML IV SOLN
10.0000 [IU] | Freq: Once | INTRAVENOUS | Status: AC
  Administered 2021-03-09: 10 [IU] via INTRAVENOUS

## 2021-03-09 MED ORDER — INSULIN ASPART 100 UNIT/ML IV SOLN: 5.0000 [IU] | Freq: Once | INTRAVENOUS | Status: DC

## 2021-03-09 MED ORDER — DEXTROSE 50 % IV SOLN: 1.0000 | Freq: Once | INTRAVENOUS | Status: DC

## 2021-03-09 MED ORDER — SODIUM BICARBONATE 8.4 % IV SOLN
50.0000 meq | Freq: Once | INTRAVENOUS | Status: AC
  Administered 2021-03-09: 50 meq via INTRAVENOUS
  Filled 2021-03-09: qty 50

## 2021-03-09 MED ORDER — ALBUTEROL SULFATE (2.5 MG/3ML) 0.083% IN NEBU: 2.5000 mg | INHALATION_SOLUTION | RESPIRATORY_TRACT | Status: DC | PRN

## 2021-03-09 MED ORDER — SODIUM CHLORIDE 0.9 % IV SOLN: INTRAVENOUS | Status: DC

## 2021-03-09 NOTE — Progress Notes (Signed)
NEW ADMISSION NOTE New Admission Note:   Arrival Method: Stretcher Mental Orientation:  A&O x4 Telemetry: Box 1 Vital and CBG completed and charted IV: Right AC 125 NS running IV team in route to place new IV Pain: denies Tubes: none Safety Measures: Safety Fall Prevention Plan has been given, discussed  Admission: in process [facility] [place] Orientation: Patient has been orientated to the room, unit and staff.  Family: none present  Orders have been reviewed and implemented. Will continue to monitor the patient. Call light has been placed within reach and bed alarm has been activated.   Berneta Levins, RN

## 2021-03-09 NOTE — Progress Notes (Signed)
Overnight progress note  Potassium was 6.1 on initial labs done at the time of admission and patient was given insulin and Lokelma.    Potassium 6.3 on repeat labs this evening, no visible hemolysis.  -Give additional medical treatment for hyperkalemia including insulin/dextrose, Lokelma, and sodium bicarb. -Give calcium gluconate -Stat repeat EKG -Repeat labs to check potassium level after treatment

## 2021-03-09 NOTE — ED Provider Notes (Signed)
[facility] EMERGENCY DEPARTMENT Provider Note   CSN: [id number] Arrival date & time: 03/09/21  1142     History  Chief Complaint  Patient presents with   Lytle Michaels    Cameron Moran is a 71 y.o. male.  HPI Patient presents via EMS after a fall.  Seemingly the patient has had falls recently, frequently.  Patient denies actual pain currently, but states that he has been progressively weak over the past few days, possibly weeks with new inability to stay upright, difficulty getting into bed.  No recent medication change, diet change, activity change.  He does note that he got a new bed which is higher than it previously was, and this is creating additional difficulty.  EMS reports no hemodynamic instability, patient was awake and alert throughout transport. Apparently patient has been seen and evaluated by EMS several times in the past few days, but only with progression of his weakness did he agree for transportation.    Home Medications Prior to Admission medications   Medication Sig Start Date End Date Taking? Authorizing Provider  albuterol (PROAIR HFA) 108 (90 Base) MCG/ACT inhaler 1-2 puffs    [provider]  allopurinol (ZYLOPRIM) 300 MG tablet Take 300 mg by mouth daily.    [provider]  ALPRAZolam Duanne Moron) 0.25 MG tablet Take 0.5 mg by mouth 2 (two) times daily as needed for anxiety.     [provider]  atorvastatin (LIPITOR) 40 MG tablet Take 1 tablet (40 mg total) by mouth daily. 08/15/18 08/15/19  Cherylann Ratel A, DO  Blood Glucose Monitoring Suppl (CONTOUR NEXT EZ) w/Device KIT See admin instructions.    [provider]  buPROPion (WELLBUTRIN XL) 300 MG 24 hr tablet 1 tablet    [provider]  citalopram (CELEXA) 20 MG tablet Take 20 mg by mouth daily.    [provider]  gabapentin (NEURONTIN) 300 MG capsule Take 300 mg by mouth See admin instructions. Take 300 mg  In the morning and the afternoon 900  mg in the evening    [provider]  HYDROcodone-acetaminophen (NORCO) 10-325 MG tablet Take 1 tablet by mouth every 6 (six) hours as needed.    [provider]  iron polysaccharides (FERREX 150) 150 MG capsule 2 capsules 10/19/20   [provider]  Levothyroxine Sodium 112 MCG CAPS Take 224 mcg by mouth daily with breakfast.     [provider]  lisinopril (PRINIVIL,ZESTRIL) 10 MG tablet Take 10 mg by mouth daily.    [provider]  metFORMIN (GLUCOPHAGE) 1000 MG tablet Take 1,000 mg by mouth 2 (two) times daily with a meal.    [provider]  Semaglutide, 1 MG/DOSE, (OZEMPIC, 1 MG/DOSE,) 2 MG/1.5ML SOPN 1 mg    [provider]  tamsulosin (FLOMAX) 0.4 MG CAPS capsule 2 capsules    [provider]  triamcinolone cream (KENALOG) 0.1 % Apply 1 application topically 2 (two) times daily.    [provider]      Allergies    Doxycycline calcium, Doxycycline calcium, and Penicillin v potassium    Review of Systems   Review of Systems  Constitutional:        Per HPI, otherwise negative  HENT:         Per HPI, otherwise negative  Respiratory:         Per HPI, otherwise negative  Cardiovascular:        Per HPI, otherwise negative  Gastrointestinal:  Negative for vomiting.  Endocrine:       Negative aside from HPI  Genitourinary:        Neg aside from HPI   Musculoskeletal:        Per HPI, otherwise negative  Skin: Negative.   Neurological:  Negative for syncope.   Physical Exam Updated Vital Signs BP (!) 112/93    Pulse 65    Temp 97.7 F (36.5 C) (Oral)    Resp 15    Ht _0  (1.753 m)    Wt (!) 170.1 kg    SpO2 94%    BMI 55.38 kg/m  Physical Exam Vitals and nursing note reviewed.  Constitutional:      Appearance: He is well-developed. He is obese.  HENT:     Head: Normocephalic and atraumatic.  Eyes:     Conjunctiva/sclera: Conjunctivae normal.  Cardiovascular:     Rate and Rhythm: Normal  rate and regular rhythm.  Pulmonary:     Effort: Pulmonary effort is normal. No respiratory distress.     Breath sounds: No stridor.  Abdominal:     General: There is no distension.  Skin:    General: Skin is warm and dry.  Neurological:     Mental Status: He is alert and oriented to person, place, and time.    ED Results / Procedures / Treatments   Labs (all labs ordered are listed, but only abnormal results are displayed) Labs Reviewed  CBC WITH DIFFERENTIAL/PLATELET - Abnormal; Notable for the following components:      Result Value   RBC 3.54 (*)    Hemoglobin 10.9 (*)    HCT 34.6 (*)    All other components within normal limits  PROTIME-INR  COMPREHENSIVE METABOLIC PANEL  RAPID URINE DRUG SCREEN, HOSP PERFORMED  ETHANOL  URINALYSIS, ROUTINE W REFLEX MICROSCOPIC  CBG MONITORING, ED    EKG EKG Interpretation  Date/Time:  Wednesday March 09 2021 11:53:42 EST Ventricular Rate:  67 PR Interval:    QRS Duration: 178 QT Interval:  451 QTC Calculation: 477 R Axis:   -62 Text Interpretation: Junctional rhythm RBBB and LAFB No significant change since last tracing Abnormal ECG Confirmed by Carmin Muskrat [phone]) on 03/09/2021 12:41:51 PM  Radiology CT HEAD WO CONTRAST  Result Date: 03/09/2021 CLINICAL DATA:  Provided history: Mental status change, unknown cause. Additional history provided: Recurring falls and weakness. EXAM: CT HEAD WITHOUT CONTRAST TECHNIQUE: Contiguous axial images were obtained from the base of the skull through the vertex without intravenous contrast. RADIATION DOSE REDUCTION: This exam was performed according to the departmental dose-optimization program which includes automated exposure control, adjustment of the mA and/or kV according to patient size and/or use of iterative reconstruction technique. COMPARISON:  Nuclear medicine bone scan 01/24/2021. FINDINGS: Brain: Mild generalized cerebral atrophy. There is no acute intracranial hemorrhage. No  demarcated cortical infarct. No extra-axial fluid collection. No evidence of an intracranial mass. No midline shift. Vascular: No hyperdense vessel.  Atherosclerotic calcifications. Skull: Normal. Negative for fracture or focal lesion. Sinuses/Orbits: Visualized orbits show no acute finding. Trace mucosal thickening within the bilateral ethmoid sinuses. Frothy secretions and moderate mucosal thickening within the left maxillary sinus. IMPRESSION: No evidence of acute intracranial abnormality. Mild generalized cerebral atrophy. Paranasal sinus disease at the imaged levels, as described. Electronically Signed   By: Kellie Simmering D.O.   On: 03/09/2021 12:38   DG Chest Port 1 View  Result Date: 03/09/2021 CLINICAL DATA:  Provided history: Altered level of consciousness, falls. EXAM:  PORTABLE CHEST 1 VIEW COMPARISON:  Prior chest radiographs 08/13/2018 and earlier. FINDINGS: Patient rotation to the right. Cardiomegaly. Unchanged mild chronic elevation of the right hemidiaphragm. No appreciable airspace consolidation or pulmonary edema. No evidence of pleural effusion or pneumothorax. No acute bony abnormality identified. IMPRESSION: No evidence of acute cardiopulmonary abnormality. Cardiomegaly. Electronically Signed   By: Kellie Simmering D.O.   On: 03/09/2021 12:40    Procedures Procedures    Medications Ordered in ED Medications  sodium chloride 0.9 % bolus 1,000 mL (1,000 mLs Intravenous New Bag/Given 03/09/21 1232)    And  0.9 %  sodium chloride infusion (has no administration in time range)    ED Course/ Medical Decision Making/ A&P                           Medical Decision Making Adult male with multiple medical issues including obesity presents with worsening weakness after a series of falls.  Broad differential including heart failure, infection, electrolyte abnormalities, stroke considered, patient placed on continuous cardiac monitoring. Cardiac 70 sinus normal Pulse ox 94% room air  borderline Patient found to have generally reassuring laboratory studies which I have interpreted, x-ray without pneumonia, pneumothorax, CT without intracranial abnormality, all reviewed, interpreted by myself.  Patient had evidence for acute kidney injury with hyperkalemia, most consistent with presentation about 1 year ago after similar development of weakness.  Patient required IV fluid resuscitation, insulin, dextrose, albuterol.  Amount and/or Complexity of Data Reviewed Independent Historian: EMS External Data Reviewed: notes.    Details: Discharge summary in June last year with similar presentation for progressive weakness. Labs: ordered. Decision-making details documented in ED Course. Radiology: ordered and independent interpretation performed. Decision-making details documented in ED Course. ECG/medicine tests: independent interpretation performed.  Risk OTC drugs. Prescription drug management. Decision regarding hospitalization.  Critical Care Total time providing critical care: 30-74 minutes (40)        Final Clinical Impression(s) / ED Diagnoses Final diagnoses:  Weakness  AKI (acute kidney injury) ([place])  Hyperkalemia     Carmin Muskrat, MD 03/09/21 1350

## 2021-03-09 NOTE — H&P (Signed)
History and Physical    Cameron Moran LOV:[id number] DOB: 09-Sep-1950 DOA: 03/09/2021  PCP: Maury Dus, MD  Patient coming from: Home  Chief Complaint: Weakness  HPI: Cameron Moran is a 71 y.o. male with medical history significant of obesity, recent diagnosis of prostate cancer stage III about to start radiation and treatment who normally walks with a walker comes in for over a week of generalized weakness and unable to walk at his baseline.  He denies any focal neurological deficits except generalized weakness.  He reports he has not been eating and drinking as well and has not had any new medication changes.  He denies any nausea vomiting or diarrhea.  He has been urinating but has been a very small dribble.  He denies any fevers.  Patient found to have acute kidney injury with a creatinine of over 3 last 1 here was over a year ago and was 1.37.  Potassium 6.1.  Patient is clinically dehydrated.   Review of Systems: As per HPI otherwise 10 point review of systems negative.   Past Medical History:  Diagnosis Date   Asthma    CAD (coronary artery disease)    Depressive disorder, not elsewhere classified    Diabetes mellitus    Gout, unspecified    Hyperlipidemia    Hypertension    Morbid obesity ([place])    Sleep apnea    Type II or unspecified type diabetes mellitus without mention of complication, uncontrolled     Past Surgical History:  Procedure Laterality Date   KNEE SURGERY  1989-90   SLEEVE GASTROPLASTY  2007     reports that he quit smoking about 26 years ago. His smoking use included cigarettes. He does not have any smokeless tobacco history on file. He reports that he does not drink alcohol. No history on file for drug use.  Allergies  Allergen Reactions   Doxycycline Calcium Other (See Comments)    Per [facility] chart note 05/31/11.   Doxycycline Calcium     Other reaction(s): rash   Penicillin V Potassium     Other reaction(s): Unknown    Family History   Problem Relation Age of Onset   Cancer Other     Prior to Admission medications   Medication Sig Start Date End Date Taking? Authorizing Provider  albuterol (PROAIR HFA) 108 (90 Base) MCG/ACT inhaler 1-2 puffs    [provider]  allopurinol (ZYLOPRIM) 300 MG tablet Take 300 mg by mouth daily.    [provider]  ALPRAZolam Duanne Moron) 0.25 MG tablet Take 0.5 mg by mouth 2 (two) times daily as needed for anxiety.     [provider]  atorvastatin (LIPITOR) 40 MG tablet Take 1 tablet (40 mg total) by mouth daily. 08/15/18 08/15/19  Cherylann Ratel A, DO  Blood Glucose Monitoring Suppl (CONTOUR NEXT EZ) w/Device KIT See admin instructions.    [provider]  buPROPion (WELLBUTRIN XL) 300 MG 24 hr tablet 1 tablet    [provider]  citalopram (CELEXA) 20 MG tablet Take 20 mg by mouth daily.    [provider]  gabapentin (NEURONTIN) 300 MG capsule Take 300 mg by mouth See admin instructions. Take 300 mg  In the morning and the afternoon 900 mg in the evening    [provider]  HYDROcodone-acetaminophen (NORCO) 10-325 MG tablet Take 1 tablet by mouth every 6 (six) hours as needed.    [provider]  iron polysaccharides (FERREX 150) 150 MG capsule 2  capsules 10/19/20   [provider]  Levothyroxine Sodium 112 MCG CAPS Take 224 mcg by mouth daily with breakfast.     [provider]  lisinopril (PRINIVIL,ZESTRIL) 10 MG tablet Take 10 mg by mouth daily.    [provider]  metFORMIN (GLUCOPHAGE) 1000 MG tablet Take 1,000 mg by mouth 2 (two) times daily with a meal.    [provider]  Semaglutide, 1 MG/DOSE, (OZEMPIC, 1 MG/DOSE,) 2 MG/1.5ML SOPN 1 mg    [provider]  tamsulosin (FLOMAX) 0.4 MG CAPS capsule 2 capsules    [provider]  triamcinolone cream (KENALOG) 0.1 % Apply 1 application topically 2 (two) times daily.    [provider]    Physical  Exam: Vitals:   03/09/21 1315 03/09/21 1330 03/09/21 1345 03/09/21 1500  BP: 111/90 116/77 95/69 129/77  Pulse: 73 74 73 85  Resp: _0 Temp:      TempSrc:      SpO2: 96% 100% 98% 96%  Weight:      Height:          Constitutional: NAD, calm, comfortable Vitals:   03/09/21 1315 03/09/21 1330 03/09/21 1345 03/09/21 1500  BP: 111/90 116/77 95/69 129/77  Pulse: 73 74 73 85  Resp: _1 Temp:      TempSrc:      SpO2: 96% 100% 98% 96%  Weight:      Height:       Eyes: PERRL, lids and conjunctivae normal ENMT: Mucous membranes are dry. Posterior pharynx clear of any exudate or lesions.Normal dentition.  Neck: normal, supple, no masses, no thyromegaly Respiratory: clear to auscultation bilaterally, no wheezing, no crackles. Normal respiratory effort. No accessory muscle use.  Cardiovascular: Regular rate and rhythm, no murmurs / rubs / gallops. No extremity edema. 2+ pedal pulses. No carotid bruits.  Abdomen: no tenderness, no masses palpated. No hepatosplenomegaly. Bowel sounds positive.  Musculoskeletal: no clubbing / cyanosis. No joint deformity upper and lower extremities. Good ROM, no contractures. Normal muscle tone.  Skin: no rashes, lesions, ulcers. No induration Neurologic: CN 2-12 grossly intact. Sensation intact, DTR normal. Strength 5/5 in all 4.  Psychiatric: Normal judgment and insight. Alert and oriented x 3. Normal mood.    Labs on Admission: I have personally reviewed following labs and imaging studies  CBC: Recent Labs  Lab 03/09/21 1230  WBC 6.5  NEUTROABS 4.8  HGB 10.9*  HCT 34.6*  MCV 97.7  PLT 606   Basic Metabolic Panel: Recent Labs  Lab 03/09/21 1230  NA 129*  K 6.1*  CL 96*  CO2 20*  GLUCOSE 96  BUN 72*  CREATININE 3.29*  CALCIUM 9.1   GFR: Estimated Creatinine Clearance: 32.2 mL/min (A) (by C-G formula based on SCr of 3.29 mg/dL (H)). Liver Function Tests: Recent Labs  Lab 03/09/21 1230  AST 37  ALT 20  ALKPHOS  47  BILITOT 0.7  PROT 6.8  ALBUMIN 3.5   No results for input(s): LIPASE, AMYLASE in the last 168 hours. No results for input(s): AMMONIA in the last 168 hours. Coagulation Profile: Recent Labs  Lab 03/09/21 1230  INR 1.1   Cardiac Enzymes: No results for input(s): CKTOTAL, CKMB, CKMBINDEX, TROPONINI in the last 168 hours. BNP (last 3 results) No results for input(s): PROBNP in the last 8760 hours. HbA1C: No results for input(s): HGBA1C in the last 72 hours. CBG: Recent Labs  Lab 03/09/21 1159  GLUCAP 94  Lipid Profile: No results for input(s): CHOL, HDL, LDLCALC, TRIG, CHOLHDL, LDLDIRECT in the last 72 hours. Thyroid Function Tests: No results for input(s): TSH, T4TOTAL, FREET4, T3FREE, THYROIDAB in the last 72 hours. Anemia Panel: No results for input(s): VITAMINB12, FOLATE, FERRITIN, TIBC, IRON, RETICCTPCT in the last 72 hours. Urine analysis:    Component Value Date/Time   COLORURINE YELLOW 08/13/2018 2320   APPEARANCEUR CLEAR 08/13/2018 2320   LABSPEC 1.012 08/13/2018 2320   PHURINE 5.0 08/13/2018 2320   GLUCOSEU NEGATIVE 08/13/2018 2320   HGBUR SMALL (A) 08/13/2018 2320   BILIRUBINUR NEGATIVE 08/13/2018 2320   KETONESUR NEGATIVE 08/13/2018 2320   PROTEINUR NEGATIVE 08/13/2018 2320   UROBILINOGEN 0.2 05/09/2010 2036   NITRITE NEGATIVE 08/13/2018 2320   LEUKOCYTESUR TRACE (A) 08/13/2018 2320   Sepsis Labs: !!!!!!!!!!!!!!!!!!!!!!!!!!!!!!!!!!!!!!!!!!!! _0 (procalcitonin:4,lacticidven:4) )No results found for this or any previous visit (from the past 240 hour(s)).   Radiological Exams on Admission: CT HEAD WO CONTRAST  Result Date: 03/09/2021 CLINICAL DATA:  Provided history: Mental status change, unknown cause. Additional history provided: Recurring falls and weakness. EXAM: CT HEAD WITHOUT CONTRAST TECHNIQUE: Contiguous axial images were obtained from the base of the skull through the vertex without intravenous contrast. RADIATION DOSE REDUCTION: This  exam was performed according to the departmental dose-optimization program which includes automated exposure control, adjustment of the mA and/or kV according to patient size and/or use of iterative reconstruction technique. COMPARISON:  Nuclear medicine bone scan 01/24/2021. FINDINGS: Brain: Mild generalized cerebral atrophy. There is no acute intracranial hemorrhage. No demarcated cortical infarct. No extra-axial fluid collection. No evidence of an intracranial mass. No midline shift. Vascular: No hyperdense vessel.  Atherosclerotic calcifications. Skull: Normal. Negative for fracture or focal lesion. Sinuses/Orbits: Visualized orbits show no acute finding. Trace mucosal thickening within the bilateral ethmoid sinuses. Frothy secretions and moderate mucosal thickening within the left maxillary sinus. IMPRESSION: No evidence of acute intracranial abnormality. Mild generalized cerebral atrophy. Paranasal sinus disease at the imaged levels, as described. Electronically Signed   By: Kellie Simmering D.O.   On: 03/09/2021 12:38   DG Chest Port 1 View  Result Date: 03/09/2021 CLINICAL DATA:  Provided history: Altered level of consciousness, falls. EXAM: PORTABLE CHEST 1 VIEW COMPARISON:  Prior chest radiographs 08/13/2018 and earlier. FINDINGS: Patient rotation to the right. Cardiomegaly. Unchanged mild chronic elevation of the right hemidiaphragm. No appreciable airspace consolidation or pulmonary edema. No evidence of pleural effusion or pneumothorax. No acute bony abnormality identified. IMPRESSION: No evidence of acute cardiopulmonary abnormality. Cardiomegaly. Electronically Signed   By: Kellie Simmering D.O.   On: 03/09/2021 12:40    EKG: Independently reviewed.  Junctional rhythm no change from previous EKG  Assessment/Plan  71 year old male with failure to thrive recent diagnosis of prostate cancer comes in dehydrated with acute kidney injury  Principal Problem:   AKI (acute kidney injury) (HCC)-patient  clinically dehydrated prerenal.  On ACE inhibitor and metformin we will hold both of these.  Avoid nephrotoxic substances.  IV fluids.  Repeat BMP later this evening to recheck potassium level that was treated in the ED initially 6.1.  Obtain bladder scan.  Active Problems:    Hyperkalemia-repeat potassium level now after treatment in the ED and reassess    Type 2 diabetes mellitus (HCC)-holding metformin at this time secondary to acute kidney injury    CAD (coronary artery disease)-stable    Difficulty in walking, not elsewhere classified-obtain physical therapy evaluation    Malignant neoplasm of prostate (HCC)-continue outpatient follow-up with radiation oncology  Further recommendations pending on overall hospital course   DVT prophylaxis: SCDs Code Status: Full Family Communication: None Disposition Plan: 1 to 3 days Consults called: None Admission status: Admission   Virginie Josten A MD [facility]  If 7PM-7AM, please contact night-coverage [email or web address] Password [facility]  03/09/2021, 3:43 PM

## 2021-03-09 NOTE — ED Triage Notes (Signed)
Pt BIB EMS for recurring falls and weakness x2 days, has fallen 4 to 5 times with fire department required to lift pt. On arrival pt was 84% on RA, EMS applied 4L per [place]. GCS 15. CBG 119. Noted wheezes in upper lobes, EMS gave 5mg  albuteral neb.

## 2021-03-10 ENCOUNTER — Inpatient Hospital Stay (HOSPITAL_COMMUNITY): Payer: Medicare Other

## 2021-03-10 LAB — CBC
HCT: 31 % — ABNORMAL LOW (ref 39.0–52.0)
Hemoglobin: 10.1 g/dL — ABNORMAL LOW (ref 13.0–17.0)
MCH: 31.1 pg (ref 26.0–34.0)
MCHC: 32.6 g/dL (ref 30.0–36.0)
MCV: 95.4 fL (ref 80.0–100.0)
Platelets: 159 10*3/uL (ref 150–400)
RBC: 3.25 MIL/uL — ABNORMAL LOW (ref 4.22–5.81)
RDW: 13.7 % (ref 11.5–15.5)
WBC: 7.2 10*3/uL (ref 4.0–10.5)
nRBC: 0 % (ref 0.0–0.2)

## 2021-03-10 LAB — BASIC METABOLIC PANEL
Anion gap: 10 (ref 5–15)
BUN: 65 mg/dL — ABNORMAL HIGH (ref 8–23)
CO2: 19 mmol/L — ABNORMAL LOW (ref 22–32)
Calcium: 8.9 mg/dL (ref 8.9–10.3)
Chloride: 105 mmol/L (ref 98–111)
Creatinine, Ser: 2.16 mg/dL — ABNORMAL HIGH (ref 0.61–1.24)
GFR, Estimated: 32 mL/min — ABNORMAL LOW (ref >60)
Glucose, Bld: 89 mg/dL (ref 70–99)
Potassium: 5.8 mmol/L — ABNORMAL HIGH (ref 3.5–5.1)
Sodium: 134 mmol/L — ABNORMAL LOW (ref 135–145)

## 2021-03-10 LAB — POTASSIUM
Potassium: 6 mmol/L — ABNORMAL HIGH (ref 3.5–5.1)
Potassium: 5.3 mmol/L — ABNORMAL HIGH (ref 3.5–5.1)
Potassium: 4.6 mmol/L (ref 3.5–5.1)

## 2021-03-10 MED ORDER — BUPROPION HCL ER (XL) 150 MG PO TB24
300.0000 mg | ORAL_TABLET | Freq: Every day | ORAL | Status: DC
  Administered 2021-03-10 – 2021-03-11 (×2): 300 mg via ORAL
  Filled 2021-03-10 (×2): qty 2

## 2021-03-10 MED ORDER — CITALOPRAM HYDROBROMIDE 20 MG PO TABS
20.0000 mg | ORAL_TABLET | Freq: Every day | ORAL | Status: DC
  Administered 2021-03-10 – 2021-03-11 (×2): 20 mg via ORAL
  Filled 2021-03-10 (×2): qty 1

## 2021-03-10 MED ORDER — MELATONIN 5 MG PO TABS
5.0000 mg | ORAL_TABLET | Freq: Every day | ORAL | Status: DC
  Administered 2021-03-10: 10 mg via ORAL
  Filled 2021-03-10: qty 2

## 2021-03-10 MED ORDER — POLYSACCHARIDE IRON COMPLEX 150 MG PO CAPS
150.0000 mg | ORAL_CAPSULE | Freq: Every day | ORAL | Status: DC
  Administered 2021-03-10 – 2021-03-11 (×2): 150 mg via ORAL
  Filled 2021-03-10 (×2): qty 1

## 2021-03-10 MED ORDER — GABAPENTIN 100 MG PO CAPS
200.0000 mg | ORAL_CAPSULE | Freq: Three times a day (TID) | ORAL | Status: DC
  Administered 2021-03-10 – 2021-03-11 (×5): 200 mg via ORAL
  Filled 2021-03-10 (×5): qty 2

## 2021-03-10 MED ORDER — TRIAMCINOLONE ACETONIDE 0.1 % EX CREA: 1.0000 "application " | TOPICAL_CREAM | Freq: Two times a day (BID) | CUTANEOUS | Status: DC | PRN

## 2021-03-10 MED ORDER — TAMSULOSIN HCL 0.4 MG PO CAPS
0.8000 mg | ORAL_CAPSULE | Freq: Every day | ORAL | Status: DC
  Administered 2021-03-10 – 2021-03-11 (×2): 0.8 mg via ORAL
  Filled 2021-03-10 (×2): qty 2

## 2021-03-10 MED ORDER — ALLOPURINOL 300 MG PO TABS
300.0000 mg | ORAL_TABLET | Freq: Every day | ORAL | Status: DC
  Administered 2021-03-10 – 2021-03-11 (×2): 300 mg via ORAL
  Filled 2021-03-10 (×2): qty 1

## 2021-03-10 MED ORDER — ALPRAZOLAM 0.5 MG PO TABS
0.5000 mg | ORAL_TABLET | Freq: Two times a day (BID) | ORAL | Status: DC | PRN
  Administered 2021-03-10: 0.5 mg via ORAL
  Filled 2021-03-10: qty 1

## 2021-03-10 MED ORDER — ALBUTEROL SULFATE (2.5 MG/3ML) 0.083% IN NEBU
10.0000 mg | INHALATION_SOLUTION | Freq: Once | RESPIRATORY_TRACT | Status: AC
  Administered 2021-03-10: 10 mg via RESPIRATORY_TRACT
  Filled 2021-03-10: qty 12

## 2021-03-10 MED ORDER — ATORVASTATIN CALCIUM 40 MG PO TABS
40.0000 mg | ORAL_TABLET | Freq: Every day | ORAL | Status: DC
  Administered 2021-03-10 – 2021-03-11 (×2): 40 mg via ORAL
  Filled 2021-03-10 (×2): qty 1

## 2021-03-10 MED ORDER — LEVOTHYROXINE SODIUM 112 MCG PO TABS
224.0000 ug | ORAL_TABLET | Freq: Every day | ORAL | Status: DC
  Administered 2021-03-10 – 2021-03-11 (×2): 224 ug via ORAL
  Filled 2021-03-10 (×2): qty 2

## 2021-03-10 MED ORDER — SODIUM ZIRCONIUM CYCLOSILICATE 5 G PO PACK
5.0000 g | PACK | Freq: Every day | ORAL | Status: DC
  Administered 2021-03-10: 5 g via ORAL
  Filled 2021-03-10 (×2): qty 1

## 2021-03-10 MED ORDER — SODIUM ZIRCONIUM CYCLOSILICATE 10 G PO PACK
10.0000 g | PACK | ORAL | Status: AC
  Administered 2021-03-10: 10 g via ORAL
  Filled 2021-03-10: qty 1

## 2021-03-10 NOTE — Progress Notes (Signed)
PROGRESS NOTE    Cameron Moran  XBM:[id number] DOB: [birth year] DOA: 03/09/2021 PCP: Maury Dus, MD   Chief Complaint  Patient presents with   Fall  Brief Narrative/Hospital Course: Cameron Moran, 71 y.o. male with PMH of morbid obesity with BMI 35, recent diagnosis of prostate cancer stage III, negative bone scans on 12/5and  about to start radiation and treatment who normally walks with a walker comes in for over a week of generalized weakness and unable to walk at his baseline along with poor oral intake He was seen in the ED found to have severe AKI hyperkalemia, COVID-19 negative, UA with negative nitrite and leukocytes.  Chest x-ray cardiomegaly, CT head-no acute finding Patient was admitted for hyperkalemia AKI hyponatremia   Subjective: Resting well, has no new complaints Had large BM this am Reports he feels better this am Overnight potassium at 6.0-S/P of breath measures insulin dextrose bicarb, creat improving 2.1, CBC stable Blood pressure is stable, afebrile.  Assessment & Plan:  AKI: Suspect prerenal with volume depletion from poor oral intake.  Urine output adequate 3063ml, continue aggressive fluid hydration monitor serial electrolytes, bladder scan.  Renal ultrasound reviewed shows medical renal disease,not acute finding. Cont to hold lisinopril. Recent Labs  Lab 03/09/21 1230 03/09/21 2028 03/10/21 0116  BUN 72* 66* 65*  CREATININE 3.29* 2.49* 2.16*    Hyperkalemia: S/P temporizing measures-insulin, bicarb and Lokelma. did Lokelma this am and add daily again and given  nebulizer albuterol x1 , repeat potassium improving. continue hydrations w/ IVF, changed to renal w/ low potassium diet Recent Labs  Lab 03/09/21 1230 03/09/21 2028 03/10/21 0116 03/10/21 0528 03/10/21 1202  K 6.1* 6.3* 5.8* 6.0* 5.3*    Type 2 diabetes mellitus: Blood sugar well controlled holding p.o. meds Recent Labs  Lab 03/09/21 1159 03/09/21 1857  GLUCAP 94 80    CAD:We  will resume patient's Lipitor.  Difficulty in walking/debility deconditioning in the setting of renal failure prostate cancer, PT OT eval  Malignant neoplasm of prostate ,negative bone scans on 12/5and  about to start radiation and treatment  Hypothyroidism continue home Synthroid.  Class III Obesity:Patient's Body mass index is 55.38 kg/m. : Will benefit with PCP follow-up, weight loss  healthy lifestyle and outpatient sleep evaluation.  DVT prophylaxis: SCDs Start: 03/09/21 1549, add heparin sq Code Status:   Code Status: Full Code Family Communication: plan of care discussed with patient at bedside. Status is: Inpatient Remains inpatient appropriate because: Ongoing management of AKI and hyperkalemia Disposition: Currently NOT medically stable for discharge. Anticipated Disposition: tbd  Total time spent in the care of this patient 50 MINUTES  Objective: Vitals last 24 hrs: Vitals:   03/10/21 0003 03/10/21 0441 03/10/21 1100 03/10/21 1251  BP: (!) 145/112 140/71 (!) 146/75   Pulse: 89 82 87   Resp: 20  17   Temp: 97.9 F (36.6 C) 98.1 F (36.7 C) 98.2 F (36.8 C)   TempSrc: Oral Oral Oral   SpO2: 94% 97% 97% 98%  Weight:      Height:       Weight change:   Intake/Output Summary (Last 24 hours) at 03/10/2021 1254 Last data filed at 03/10/2021 0900 Gross per 24 hour  Intake 1180 ml  Output 4075 ml  Net -2895 ml   Net IO Since Admission: -2,895 mL [03/10/21 1254]   Physical Examination: General exam: AA0x3,weak,older than stated age. HEENT:Oral mucosa moist, Ear/Nose WNL grossly,dentition normal. Respiratory system: B/l clear BS, no use of accessory  muscle, non tender. Cardiovascular system: S1 & S2 +,No JVD. Gastrointestinal system: Abdomen soft, NT,ND, BS+. Nervous System:Alert, awake, moving extremities. Extremities: edema b/l LE,distal peripheral pulses palpable.  Skin:No rashes, no icterus. JXB:JYNWGN muscle bulk, tone, power.  Medications reviewed:   Scheduled Meds:  allopurinol  300 mg Oral Daily   atorvastatin  40 mg Oral Daily   buPROPion  300 mg Oral Daily   citalopram  20 mg Oral Daily   gabapentin  200 mg Oral TID   iron polysaccharides  150 mg Oral Daily   levothyroxine  224 mcg Oral Q0600   melatonin  5-10 mg Oral QHS   sodium zirconium cyclosilicate  5 g Oral Daily   tamsulosin  0.8 mg Oral QPC supper  Continuous Infusions:  sodium chloride 125 mL/hr at 03/10/21 5621   Diet Order             Diet renal with fluid restriction Fluid restriction: 2000 mL Fluid; Room service appropriate? Yes; Fluid consistency: Thin  Diet effective now                 Weight change:   Wt Readings from Last 3 Encounters:  03/09/21 (!) 170.1 kg  02/25/21 (!) 170.1 kg  01/24/21 (!) 180.4 kg     Consultants:see note  Procedures:see note Antimicrobials: Anti-infectives (From admission, onward)    None      Culture/Microbiology    Component Value Date/Time   SDES WOUND LEG RIGHT 05/10/2010 2022   [place] NONE 05/10/2010 2022   CULT NO GROWTH 2 DAYS 05/10/2010 2022   REPTSTATUS 05/13/2010 FINAL 05/10/2010 2022    Other culture-see note  Unresulted Labs (From admission, onward)     Start     Ordered   03/11/21 3086  Basic metabolic panel  Daily,   R     Question:  Specimen collection method  Answer:  Lab=Lab collect   03/10/21 0836   03/11/21 0500  CBC  Daily,   R     Question:  Specimen collection method  Answer:  Lab=Lab collect   03/10/21 0836   03/10/21 1130  Potassium  STAT Now then every 2 hours,   R (with STAT occurrences)      03/10/21 0837          Data Reviewed: I have personally reviewed following labs and imaging studies CBC: Recent Labs  Lab 03/09/21 1230 03/10/21 0116  WBC 6.5 7.2  NEUTROABS 4.8  --   HGB 10.9* 10.1*  HCT 34.6* 31.0*  MCV 97.7 95.4  PLT 166 578   Basic Metabolic Panel: Recent Labs  Lab 03/09/21 1230 03/09/21 2028 03/10/21 0116 03/10/21 0528 03/10/21 1202  NA  129* 130* 134*  --   --   K 6.1* 6.3* 5.8* 6.0* 5.3*  CL 96* 102 105  --   --   CO2 20* 18* 19*  --   --   GLUCOSE 96 102* 89  --   --   BUN 72* 66* 65*  --   --   CREATININE 3.29* 2.49* 2.16*  --   --   CALCIUM 9.1 8.8* 8.9  --   --    GFR: Estimated Creatinine Clearance: 49 mL/min (A) (by C-G formula based on SCr of 2.16 mg/dL (H)). Liver Function Tests: Recent Labs  Lab 03/09/21 1230  AST 37  ALT 20  ALKPHOS 47  BILITOT 0.7  PROT 6.8  ALBUMIN 3.5   No results for input(s): LIPASE, AMYLASE in the  last 168 hours. No results for input(s): AMMONIA in the last 168 hours. Coagulation Profile: Recent Labs  Lab 03/09/21 1230  INR 1.1   Cardiac Enzymes: No results for input(s): CKTOTAL, CKMB, CKMBINDEX, TROPONINI in the last 168 hours. BNP (last 3 results) No results for input(s): PROBNP in the last 8760 hours. HbA1C: No results for input(s): HGBA1C in the last 72 hours. CBG: Recent Labs  Lab 03/09/21 1159 03/09/21 1857  GLUCAP 94 80   Lipid Profile: No results for input(s): CHOL, HDL, LDLCALC, TRIG, CHOLHDL, LDLDIRECT in the last 72 hours. Thyroid Function Tests: No results for input(s): TSH, T4TOTAL, FREET4, T3FREE, THYROIDAB in the last 72 hours. Anemia Panel: No results for input(s): VITAMINB12, FOLATE, FERRITIN, TIBC, IRON, RETICCTPCT in the last 72 hours. Sepsis Labs: No results for input(s): PROCALCITON, LATICACIDVEN in the last 168 hours.  Recent Results (from the past 240 hour(s))  Resp Panel by RT-PCR (Flu A&B, Covid) Nasopharyngeal Swab     Status: None   Collection Time: 03/09/21  3:29 PM   Specimen: Nasopharyngeal Swab; Nasopharyngeal(NP) swabs in vial transport medium  Result Value Ref Range Status   SARS Coronavirus 2 by RT PCR NEGATIVE NEGATIVE Final    Comment: (NOTE) SARS-CoV-2 target nucleic acids are NOT DETECTED.  The SARS-CoV-2 RNA is generally detectable in upper respiratory specimens during the acute phase of infection. The  lowest concentration of SARS-CoV-2 viral copies this assay can detect is 138 copies/mL. A negative result does not preclude SARS-Cov-2 infection and should not be used as the sole basis for treatment or other patient management decisions. A negative result may occur with  improper specimen collection/handling, submission of specimen other than nasopharyngeal swab, presence of viral mutation(s) within the areas targeted by this assay, and inadequate number of viral copies(<138 copies/mL). A negative result must be combined with clinical observations, patient history, and epidemiological information. The expected result is Negative.  Fact Sheet for Patients:  EntrepreneurPulse.com.au  Fact Sheet for Healthcare Providers:  IncredibleEmployment.be  This test is no t yet approved or cleared by the [place] FDA and  has been authorized for detection and/or diagnosis of SARS-CoV-2 by FDA under an Emergency Use Authorization (EUA). This EUA will remain  in effect (meaning this test can be used) for the duration of the COVID-19 declaration under Section 564(b)(1) of the Act, 21 U.S.C.section 360bbb-3(b)(1), unless the authorization is terminated  or revoked sooner.       Influenza A by PCR NEGATIVE NEGATIVE Final   Influenza B by PCR NEGATIVE NEGATIVE Final    Comment: (NOTE) The Xpert Xpress SARS-CoV-2/FLU/RSV plus assay is intended as an aid in the diagnosis of influenza from Nasopharyngeal swab specimens and should not be used as a sole basis for treatment. Nasal washings and aspirates are unacceptable for Xpert Xpress SARS-CoV-2/FLU/RSV testing.  Fact Sheet for Patients: EntrepreneurPulse.com.au  Fact Sheet for Healthcare Providers: IncredibleEmployment.be  This test is not yet approved or cleared by the [place] FDA and has been authorized for detection and/or diagnosis of SARS-CoV-2 by FDA under  an Emergency Use Authorization (EUA). This EUA will remain in effect (meaning this test can be used) for the duration of the COVID-19 declaration under Section 564(b)(1) of the Act, 21 U.S.C. section 360bbb-3(b)(1), unless the authorization is terminated or revoked.  Performed at [facility], [place] [address], [place], [place] [address]   Radiology Studies: CT HEAD WO CONTRAST  Result Date: 03/09/2021 CLINICAL DATA:  Provided history: Mental status change, unknown  cause. Additional history provided: Recurring falls and weakness. EXAM: CT HEAD WITHOUT CONTRAST TECHNIQUE: Contiguous axial images were obtained from the base of the skull through the vertex without intravenous contrast. RADIATION DOSE REDUCTION: This exam was performed according to the departmental dose-optimization program which includes automated exposure control, adjustment of the mA and/or kV according to patient size and/or use of iterative reconstruction technique. COMPARISON:  Nuclear medicine bone scan 01/24/2021. FINDINGS: Brain: Mild generalized cerebral atrophy. There is no acute intracranial hemorrhage. No demarcated cortical infarct. No extra-axial fluid collection. No evidence of an intracranial mass. No midline shift. Vascular: No hyperdense vessel.  Atherosclerotic calcifications. Skull: Normal. Negative for fracture or focal lesion. Sinuses/Orbits: Visualized orbits show no acute finding. Trace mucosal thickening within the bilateral ethmoid sinuses. Frothy secretions and moderate mucosal thickening within the left maxillary sinus. IMPRESSION: No evidence of acute intracranial abnormality. Mild generalized cerebral atrophy. Paranasal sinus disease at the imaged levels, as described. Electronically Signed   By: Kellie Simmering D.O.   On: 03/09/2021 12:38   US RENAL  Result Date: 03/10/2021 CLINICAL DATA:  Acute kidney injury EXAM: RENAL / URINARY TRACT ULTRASOUND COMPLETE COMPARISON:  CT examination dated 01/24/2021  FINDINGS: Right Kidney: Renal measurements: 12.1 x 5.7 x 6.0 = volume: 215 mL. Echogenic renal cortex concerning for medical renal disease. No mass or hydronephrosis visualized. Left Kidney: Renal measurements: 12.8 x 4.7 x 5.0 = volume: 155 mL. Echogenic renal cortex concerning for medical renal disease. No mass or hydronephrosis visualized. Bladder: Appears normal for degree of bladder distention. Other: None. IMPRESSION: 1. Echogenic renal cortical echotexture concerning for medical renal disease. 2.  No evidence of nephrolithiasis or hydronephrosis. Electronically Signed   By: Keane Police D.O.   On: 03/10/2021 10:57   DG Chest Port 1 View  Result Date: 03/09/2021 CLINICAL DATA:  Provided history: Altered level of consciousness, falls. EXAM: PORTABLE CHEST 1 VIEW COMPARISON:  Prior chest radiographs 08/13/2018 and earlier. FINDINGS: Patient rotation to the right. Cardiomegaly. Unchanged mild chronic elevation of the right hemidiaphragm. No appreciable airspace consolidation or pulmonary edema. No evidence of pleural effusion or pneumothorax. No acute bony abnormality identified. IMPRESSION: No evidence of acute cardiopulmonary abnormality. Cardiomegaly. Electronically Signed   By: Kellie Simmering D.O.   On: 03/09/2021 12:40     LOS: 1 day   Antonieta Pert, MD [facility]  03/10/2021, 12:54 PM

## 2021-03-10 NOTE — Plan of Care (Signed)
  Problem: Education: Goal: Knowledge of General Education information will improve Description Including pain rating scale, medication(s)/side effects and non-pharmacologic comfort measures Outcome: Progressing   

## 2021-03-10 NOTE — Plan of Care (Signed)
Initiated

## 2021-03-10 NOTE — Evaluation (Signed)
Physical Therapy Evaluation Patient Details Name: Cameron Moran MRN: [id number] DOB: [birth year] Today's Date: 03/10/2021  History of Present Illness  71 y.o. male presents to [facility] hospital on 03/09/2021 with generalized weakness, found hyperkalemic. PMH includes asthma, CAD, DMII, HLD, HTN, prostate cancer  Clinical Impression  Pt presents to PT with deficits in strength, power, balance, functional mobility, endurance. Pt is at a high risk for falls due to LE weakness and instability. Pt electing to reach for UE support of walls/doors/furniture, and at times reaching far away from his base of support to obtain grasp of these objects. Pt with one lateral loss of balance when reaching for an object for support. Pt will benefit from acute PT services to improve LE strength and activity tolerance in an effort to reduce falls risk. PT recommends discharge home with HHPT as well as a bariatric RW and bariatric BSC. Pt will benefit from further acute PT before going home.       Recommendations for follow up therapy are one component of a multi-disciplinary discharge planning process, led by the attending physician.  Recommendations may be updated based on patient status, additional functional criteria and insurance authorization.  Follow Up Recommendations Home health PT    Assistance Recommended at Discharge Intermittent Supervision/Assistance  Patient can return home with the following  A little help with walking and/or transfers;Help with stairs or ramp for entrance    Equipment Recommendations Other (comment) (bariatric RW and bariatric BSC)  Recommendations for Other Services       Functional Status Assessment Patient has had a recent decline in their functional status and demonstrates the ability to make significant improvements in function in a reasonable and predictable amount of time.     Precautions / Restrictions Precautions Precautions: Fall Restrictions Weight Bearing  Restrictions: No      Mobility  Bed Mobility Overal bed mobility: Needs Assistance Bed Mobility: Supine to Sit, Sit to Supine     Supine to sit: Min assist, HOB elevated Sit to supine: Min assist   General bed mobility comments: assist to pull trunk into sitting, assist to elevate LEs when returning to supine    Transfers Overall transfer level: Needs assistance Equipment used: 1 person hand held assist Transfers: Sit to/from Stand Sit to Stand: Min assist           General transfer comment: pt pulling on bed rail and PT hand hold    Ambulation/Gait Ambulation/Gait assistance: Min assist Gait Distance (Feet): 10 Feet (10' x 2) Assistive device:  (utilizing UE support of door/walls/railings when available) Gait Pattern/deviations: Step-to pattern, Trunk flexed, Wide base of support Gait velocity: reduced Gait velocity interpretation: <1.31 ft/sec, indicative of household ambulator   General Gait Details: pt mobilizing with widened BOS and shortened step-to gait, reaching for UE support with every step. Pt declines use of walker at this time (will need bariatric walker)  Stairs            Wheelchair Mobility    Modified Rankin (Stroke Patients Only)       Balance Overall balance assessment: Needs assistance Sitting-balance support: No upper extremity supported, Feet supported Sitting balance-Leahy Scale: Good     Standing balance support: Single extremity supported, Bilateral upper extremity supported, Reliant on assistive device for balance Standing balance-Leahy Scale: Poor                               Pertinent Vitals/Pain Pain  Assessment Pain Assessment: Faces Faces Pain Scale: Hurts little more Pain Location: BLE Pain Descriptors / Indicators: Grimacing Pain Intervention(s): Monitored during session    Home Living Family/patient expects to be discharged to:: Private residence Living Arrangements: Spouse/significant  other Available Help at Discharge: Family;Available 24 hours/day Type of Home: House Home Access: Stairs to enter Entrance Stairs-Rails: None Entrance Stairs-Number of Steps: 2   Home Layout: One level Home Equipment: Cane - single point;Rollator (4 wheels) (pt reports he had a bariatric RW however it broke when he fell on it)      Prior Function Prior Level of Function : History of Falls (last six months);Needs assist             Mobility Comments: pt reports an acute on chronic decline in mobility over the last 2 years. Pt has been able to ambulate with SPC vs RW but has recently had multiple falls and is only able to ambulate short distances       Hand Dominance        Extremity/Trunk Assessment   Upper Extremity Assessment Upper Extremity Assessment: Overall WFL for tasks assessed    Lower Extremity Assessment Lower Extremity Assessment: Generalized weakness    Cervical / Trunk Assessment Cervical / Trunk Assessment: Other exceptions Cervical / Trunk Exceptions: morbid obesity  Communication   Communication: No difficulties  Cognition Arousal/Alertness: Awake/alert Behavior During Therapy: WFL for tasks assessed/performed Overall Cognitive Status: Within Functional Limits for tasks assessed                                          General Comments General comments (skin integrity, edema, etc.): VSS on RA    Exercises     Assessment/Plan    PT Assessment Patient needs continued PT services  PT Problem List Decreased strength;Decreased activity tolerance;Decreased balance;Decreased mobility       PT Treatment Interventions DME instruction;Gait training;Stair training;Functional mobility training;Therapeutic activities;Therapeutic exercise;Balance training;Patient/family education    PT Goals (Current goals can be found in the Care Plan section)  Acute Rehab PT Goals Patient Stated Goal: to improve LE strength and to stop falling PT  Goal Formulation: With patient Time For Goal Achievement: 03/24/21 Potential to Achieve Goals: Fair    Frequency Min 3X/week     Co-evaluation               AM-PAC PT "6 Clicks" Mobility  Outcome Measure Help needed turning from your back to your side while in a flat bed without using bedrails?: None Help needed moving from lying on your back to sitting on the side of a flat bed without using bedrails?: A Little Help needed moving to and from a bed to a chair (including a wheelchair)?: A Little Help needed standing up from a chair using your arms (e.g., wheelchair or bedside chair)?: A Little Help needed to walk in hospital room?: Total Help needed climbing 3-5 steps with a railing? : Total 6 Click Score: 15    End of Session   Activity Tolerance: Patient limited by fatigue Patient left: in bed;with call bell/phone within reach;with bed alarm set Nurse Communication: Mobility status PT Visit Diagnosis: Other abnormalities of gait and mobility (R26.89);Muscle weakness (generalized) (M62.81);History of falling (Z91.81)    Time: 8502-7741 PT Time Calculation (min) (ACUTE ONLY): 24 min   Charges:   PT Evaluation $PT Eval Low Complexity: 1 Low  Zenaida Niece, PT, DPT Acute Rehabilitation Pager: [phone] Office [place] 03/10/2021, 2:14 PM

## 2021-03-10 NOTE — Progress Notes (Signed)
During bed shift change huddle, breakfast tray came, patient has orange juice and banana on his tray, took them out and explained to patient why he cant have them because of high potassium, pt was a bit upset but understandable.

## 2021-03-10 NOTE — Progress Notes (Signed)
Critical potassium of 6.3, On call md notified, orders entered by MD. Stat EKG done. Charge nurse assisted with med pass and care. Wife updated and notified of patient current status.

## 2021-03-11 LAB — CBC
HCT: 32.4 % — ABNORMAL LOW (ref 39.0–52.0)
Hemoglobin: 10.8 g/dL — ABNORMAL LOW (ref 13.0–17.0)
MCH: 31.8 pg (ref 26.0–34.0)
MCHC: 33.3 g/dL (ref 30.0–36.0)
MCV: 95.3 fL (ref 80.0–100.0)
Platelets: 147 10*3/uL — ABNORMAL LOW (ref 150–400)
RBC: 3.4 MIL/uL — ABNORMAL LOW (ref 4.22–5.81)
RDW: 13.8 % (ref 11.5–15.5)
WBC: 5.7 10*3/uL (ref 4.0–10.5)
nRBC: 0 % (ref 0.0–0.2)

## 2021-03-11 LAB — BASIC METABOLIC PANEL
Anion gap: 8 (ref 5–15)
BUN: 32 mg/dL — ABNORMAL HIGH (ref 8–23)
CO2: 23 mmol/L (ref 22–32)
Calcium: 9.4 mg/dL (ref 8.9–10.3)
Chloride: 110 mmol/L (ref 98–111)
Creatinine, Ser: 1.03 mg/dL (ref 0.61–1.24)
GFR, Estimated: >60 mL/min (ref >60)
Glucose, Bld: 118 mg/dL — ABNORMAL HIGH (ref 70–99)
Potassium: 4.3 mmol/L (ref 3.5–5.1)
Sodium: 141 mmol/L (ref 135–145)

## 2021-03-11 MED ORDER — LOPERAMIDE HCL 2 MG PO TABS: 2.0000 mg | ORAL_TABLET | Freq: Four times a day (QID) | ORAL | 0 refills | Status: AC | PRN

## 2021-03-11 NOTE — Discharge Summary (Signed)
Physician Discharge Summary  Cameron Moran KGU:[id number] DOB: Jul 17, 1950 DOA: 03/09/2021  PCP: Maury Dus, MD  Admit date: 03/09/2021 Discharge date: 03/11/2021  Admitted From: home Disposition:  hhpt  Recommendations for Outpatient Follow-up:  Follow up with PCP in 1-2 weeks Please obtain BMP/CBC in one week  Home Health:yes  Equipment/Devices: Bariatric RW and bariatric BSC  Discharge Condition: Stable Code Status:   Code Status: Full Code Diet recommendation:  Diet Order             Diet renal with fluid restriction Fluid restriction: 2000 mL Fluid; Room service appropriate? Yes; Fluid consistency: Thin  Diet effective now                    Brief/Interim Summary: 71 y.o. male with PMH of morbid obesity with BMI 35, recent diagnosis of prostate cancer stage III, negative bone scans on 12/5and  about to start radiation and treatment who normally walks with a walker comes in for over a week of generalized weakness and unable to walk at his baseline along with poor oral intake He was seen in the ED found to have severe AKI hyperkalemia, COVID-19 negative, UA with negative nitrite and leukocytes.  Chest x-ray cardiomegaly, CT head-no acute finding Patient was admitted for hyperkalemia AKI hyponatremia Patient is admitted underwent aggressive IV fluid hydration oral intake encouraged, creatinine now has improved to baseline hyperkalemia resolved. He will be advised to follow-up with PCP and hematology. Seen by PT OT and has recommended home health PT. Discharge Diagnoses:   AKI: Suspect prerenal with volume depletion from poor oral intake.Renal ultrasound reviewed shows medical renal disease,not acute finding.  AKI has resolved, encourage oral intake discontinue IV fluids follow-up with PCP and hematology Recent Labs  Lab 03/09/21 1230 03/09/21 2028 03/10/21 0116 03/11/21 0415  BUN 72* 66* 65* 32*  CREATININE 3.29* 2.49* 2.16* 1.03    Hyperkalemia: Resolved.  D/c  lokelam, he endorses loose stool 1/19 fro mk lokelma,but just had 1 bm today and with solid chunks.   Type 2 diabetes mellitus: Blood sugar well controlled holding p.o. meds  CAD:We will resume patient's Lipitor.   Difficulty in walking/debility deconditioning in the setting of renal failure prostate cancer, PT OT eval   Malignant neoplasm of prostate ,negative bone scans on 12/5and  about to start radiation and treatment   Hypothyroidism continue home Synthroid.   Class III Obesity:Patient's Body mass index is 55.38 kg/m. : Will benefit with PCP follow-up, weight loss  healthy lifestyle and outpatient sleep evaluation  Plan of care discussed with wife fore d/c home today.  Consults: none  Subjective: Aaox3, feels well, wants to go home today.  Discharge Exam: Vitals:   03/11/21 0424 03/11/21 0846  BP: 125/75 (!) 153/84  Pulse: 81 74  Resp: 19 16  Temp: 98 F (36.7 C) 98.3 F (36.8 C)  SpO2: 98% 93%   General: Pt is alert, awake, not in acute distress Cardiovascular: RRR, S1/S2 +, no rubs, no gallops Respiratory: CTA bilaterally, no wheezing, no rhonchi Abdominal: Soft, NT, ND, bowel sounds + Extremities: no edema, no cyanosis  Discharge Instructions  Discharge Instructions     Increase activity slowly   Complete by: As directed       Allergies as of 03/11/2021       Reactions   Doxycycline Calcium Other (See Comments)   Per [facility] chart note 05/31/11. Rash   Penicillin V Potassium    Other reaction(s): Unknown  Medication List     TAKE these medications    albuterol 108 (90 Base) MCG/ACT inhaler Commonly known as: VENTOLIN HFA 2 puffs every 6 (six) hours as needed for wheezing or shortness of breath.   allopurinol 300 MG tablet Commonly known as: ZYLOPRIM Take 300 mg by mouth daily.   ALPRAZolam 0.25 MG tablet Commonly known as: XANAX Take 0.5 mg by mouth 2 (two) times daily as needed for anxiety.   atorvastatin 40 MG tablet Commonly  known as: Lipitor Take 1 tablet (40 mg total) by mouth daily.   buPROPion 300 MG 24 hr tablet Commonly known as: WELLBUTRIN XL 300 mg daily.   citalopram 20 MG tablet Commonly known as: CELEXA Take 20 mg by mouth daily.   Contour Next EZ w/Device Kit See admin instructions.   gabapentin 300 MG capsule Commonly known as: NEURONTIN Take 600 mg by mouth 3 (three) times daily.   HYDROcodone-acetaminophen 5-325 MG tablet Commonly known as: NORCO/VICODIN Take 1 tablet by mouth See admin instructions. Takes 2 tablets in the morning, 1 tablet at noon and 2 tablets at bedtime as needed. What changed: Another medication with the same name was removed. Continue taking this medication, and follow the directions you see here.   iron polysaccharides 150 MG capsule Commonly known as: NIFEREX Take 150 mg by mouth daily.   Levothyroxine Sodium 112 MCG Caps Take 224 mcg by mouth daily with breakfast.   lisinopril 10 MG tablet Commonly known as: ZESTRIL Take 10 mg by mouth daily.   loperamide 2 MG tablet Commonly known as: Imodium A-D Take 1 tablet (2 mg total) by mouth 4 (four) times daily as needed for diarrhea or loose stools.   melatonin 5 MG Tabs Take 5-10 mg by mouth at bedtime.   metFORMIN 1000 MG tablet Commonly known as: GLUCOPHAGE Take 1,000 mg by mouth 2 (two) times daily with a meal.   Ozempic (1 MG/DOSE) 2 MG/1.5ML Sopn Generic drug: Semaglutide (1 MG/DOSE) Inject 1 mg into the muscle once a week.   tamsulosin 0.4 MG Caps capsule Commonly known as: FLOMAX 0.8 mg daily.   triamcinolone cream 0.1 % Commonly known as: KENALOG Apply 1 application topically 2 (two) times daily as needed (itching).               Durable Medical Equipment  (From admission, onward)           Start     Ordered   03/11/21 1338  DME Walker  Once       Question Answer Comment  Walker: With 5 Inch Wheels   Patient needs a walker to treat with the following condition Physical  deconditioning      03/11/21 1338            Follow-up Information     Maury Dus, MD Follow up in 1 week(s).   Specialty: Family Medicine Contact information: [address] [place] [address] [phone]                Allergies  Allergen Reactions   Doxycycline Calcium Other (See Comments)    Per [facility] chart note 05/31/11. Rash   Penicillin V Potassium     Other reaction(s): Unknown    The results of significant diagnostics from this hospitalization (including imaging, microbiology, ancillary and laboratory) are listed below for reference.    Microbiology: Recent Results (from the past 240 hour(s))  Resp Panel by RT-PCR (Flu A&B, Covid) Nasopharyngeal Swab  Status: None   Collection Time: 03/09/21  3:29 PM   Specimen: Nasopharyngeal Swab; Nasopharyngeal(NP) swabs in vial transport medium  Result Value Ref Range Status   SARS Coronavirus 2 by RT PCR NEGATIVE NEGATIVE Final    Comment: (NOTE) SARS-CoV-2 target nucleic acids are NOT DETECTED.  The SARS-CoV-2 RNA is generally detectable in upper respiratory specimens during the acute phase of infection. The lowest concentration of SARS-CoV-2 viral copies this assay can detect is 138 copies/mL. A negative result does not preclude SARS-Cov-2 infection and should not be used as the sole basis for treatment or other patient management decisions. A negative result may occur with  improper specimen collection/handling, submission of specimen other than nasopharyngeal swab, presence of viral mutation(s) within the areas targeted by this assay, and inadequate number of viral copies(<138 copies/mL). A negative result must be combined with clinical observations, patient history, and epidemiological information. The expected result is Negative.  Fact Sheet for Patients:  EntrepreneurPulse.com.au  Fact Sheet for Healthcare Providers:   IncredibleEmployment.be  This test is no t yet approved or cleared by the [place] FDA and  has been authorized for detection and/or diagnosis of SARS-CoV-2 by FDA under an Emergency Use Authorization (EUA). This EUA will remain  in effect (meaning this test can be used) for the duration of the COVID-19 declaration under Section 564(b)(1) of the Act, 21 U.S.C.section 360bbb-3(b)(1), unless the authorization is terminated  or revoked sooner.       Influenza A by PCR NEGATIVE NEGATIVE Final   Influenza B by PCR NEGATIVE NEGATIVE Final    Comment: (NOTE) The Xpert Xpress SARS-CoV-2/FLU/RSV plus assay is intended as an aid in the diagnosis of influenza from Nasopharyngeal swab specimens and should not be used as a sole basis for treatment. Nasal washings and aspirates are unacceptable for Xpert Xpress SARS-CoV-2/FLU/RSV testing.  Fact Sheet for Patients: EntrepreneurPulse.com.au  Fact Sheet for Healthcare Providers: IncredibleEmployment.be  This test is not yet approved or cleared by the [place] FDA and has been authorized for detection and/or diagnosis of SARS-CoV-2 by FDA under an Emergency Use Authorization (EUA). This EUA will remain in effect (meaning this test can be used) for the duration of the COVID-19 declaration under Section 564(b)(1) of the Act, 21 U.S.C. section 360bbb-3(b)(1), unless the authorization is terminated or revoked.  Performed at [facility], [place] [address] [place], [place] [address]     Procedures/Studies: CT HEAD WO CONTRAST  Result Date: 03/09/2021 CLINICAL DATA:  Provided history: Mental status change, unknown cause. Additional history provided: Recurring falls and weakness. EXAM: CT HEAD WITHOUT CONTRAST TECHNIQUE: Contiguous axial images were obtained from the base of the skull through the vertex without intravenous contrast. RADIATION DOSE REDUCTION: This exam was performed  according to the departmental dose-optimization program which includes automated exposure control, adjustment of the mA and/or kV according to patient size and/or use of iterative reconstruction technique. COMPARISON:  Nuclear medicine bone scan 01/24/2021. FINDINGS: Brain: Mild generalized cerebral atrophy. There is no acute intracranial hemorrhage. No demarcated cortical infarct. No extra-axial fluid collection. No evidence of an intracranial mass. No midline shift. Vascular: No hyperdense vessel.  Atherosclerotic calcifications. Skull: Normal. Negative for fracture or focal lesion. Sinuses/Orbits: Visualized orbits show no acute finding. Trace mucosal thickening within the bilateral ethmoid sinuses. Frothy secretions and moderate mucosal thickening within the left maxillary sinus. IMPRESSION: No evidence of acute intracranial abnormality. Mild generalized cerebral atrophy. Paranasal sinus disease at the imaged levels, as described. Electronically Signed  By: Kellie Simmering D.O.   On: 03/09/2021 12:38   US RENAL  Result Date: 03/10/2021 CLINICAL DATA:  Acute kidney injury EXAM: RENAL / URINARY TRACT ULTRASOUND COMPLETE COMPARISON:  CT examination dated 01/24/2021 FINDINGS: Right Kidney: Renal measurements: 12.1 x 5.7 x 6.0 = volume: 215 mL. Echogenic renal cortex concerning for medical renal disease. No mass or hydronephrosis visualized. Left Kidney: Renal measurements: 12.8 x 4.7 x 5.0 = volume: 155 mL. Echogenic renal cortex concerning for medical renal disease. No mass or hydronephrosis visualized. Bladder: Appears normal for degree of bladder distention. Other: None. IMPRESSION: 1. Echogenic renal cortical echotexture concerning for medical renal disease. 2.  No evidence of nephrolithiasis or hydronephrosis. Electronically Signed   By: Keane Police D.O.   On: 03/10/2021 10:57   DG Chest Port 1 View  Result Date: 03/09/2021 CLINICAL DATA:  Provided history: Altered level of consciousness, falls. EXAM:  PORTABLE CHEST 1 VIEW COMPARISON:  Prior chest radiographs 08/13/2018 and earlier. FINDINGS: Patient rotation to the right. Cardiomegaly. Unchanged mild chronic elevation of the right hemidiaphragm. No appreciable airspace consolidation or pulmonary edema. No evidence of pleural effusion or pneumothorax. No acute bony abnormality identified. IMPRESSION: No evidence of acute cardiopulmonary abnormality. Cardiomegaly. Electronically Signed   By: Kellie Simmering D.O.   On: 03/09/2021 12:40    Labs: BNP (last 3 results) No results for input(s): BNP in the last 8760 hours. Basic Metabolic Panel: Recent Labs  Lab 03/09/21 1230 03/09/21 2028 03/10/21 0116 03/10/21 0528 03/10/21 1202 03/10/21 1424 03/11/21 0415  NA 129* 130* 134*  --   --   --  141  K 6.1* 6.3* 5.8* 6.0* 5.3* 4.6 4.3  CL 96* 102 105  --   --   --  110  CO2 20* 18* 19*  --   --   --  23  GLUCOSE 96 102* 89  --   --   --  118*  BUN 72* 66* 65*  --   --   --  32*  CREATININE 3.29* 2.49* 2.16*  --   --   --  1.03  CALCIUM 9.1 8.8* 8.9  --   --   --  9.4   Liver Function Tests: Recent Labs  Lab 03/09/21 1230  AST 37  ALT 20  ALKPHOS 47  BILITOT 0.7  PROT 6.8  ALBUMIN 3.5   No results for input(s): LIPASE, AMYLASE in the last 168 hours. No results for input(s): AMMONIA in the last 168 hours. CBC: Recent Labs  Lab 03/09/21 1230 03/10/21 0116 03/11/21 0415  WBC 6.5 7.2 5.7  NEUTROABS 4.8  --   --   HGB 10.9* 10.1* 10.8*  HCT 34.6* 31.0* 32.4*  MCV 97.7 95.4 95.3  PLT 166 159 147*   Cardiac Enzymes: No results for input(s): CKTOTAL, CKMB, CKMBINDEX, TROPONINI in the last 168 hours. BNP: Invalid input(s): POCBNP CBG: Recent Labs  Lab 03/09/21 1159 03/09/21 1857  GLUCAP 94 80   D-Dimer No results for input(s): DDIMER in the last 72 hours. Hgb A1c No results for input(s): HGBA1C in the last 72 hours. Lipid Profile No results for input(s): CHOL, HDL, LDLCALC, TRIG, CHOLHDL, LDLDIRECT in the last 72  hours. Thyroid function studies No results for input(s): TSH, T4TOTAL, T3FREE, THYROIDAB in the last 72 hours.  Invalid input(s): FREET3 Anemia work up No results for input(s): VITAMINB12, FOLATE, FERRITIN, TIBC, IRON, RETICCTPCT in the last 72 hours. Urinalysis    Component Value Date/Time   COLORURINE YELLOW  03/09/2021 1430   APPEARANCEUR CLEAR 03/09/2021 1430   LABSPEC 1.010 03/09/2021 1430   PHURINE 5.5 03/09/2021 1430   GLUCOSEU NEGATIVE 03/09/2021 1430   HGBUR SMALL (A) 03/09/2021 1430   BILIRUBINUR NEGATIVE 03/09/2021 1430   KETONESUR NEGATIVE 03/09/2021 1430   PROTEINUR NEGATIVE 03/09/2021 1430   UROBILINOGEN 0.2 05/09/2010 2036   NITRITE NEGATIVE 03/09/2021 1430   LEUKOCYTESUR NEGATIVE 03/09/2021 1430   Sepsis Labs Invalid input(s): PROCALCITONIN,  WBC,  LACTICIDVEN Microbiology Recent Results (from the past 240 hour(s))  Resp Panel by RT-PCR (Flu A&B, Covid) Nasopharyngeal Swab     Status: None   Collection Time: 03/09/21  3:29 PM   Specimen: Nasopharyngeal Swab; Nasopharyngeal(NP) swabs in vial transport medium  Result Value Ref Range Status   SARS Coronavirus 2 by RT PCR NEGATIVE NEGATIVE Final    Comment: (NOTE) SARS-CoV-2 target nucleic acids are NOT DETECTED.  The SARS-CoV-2 RNA is generally detectable in upper respiratory specimens during the acute phase of infection. The lowest concentration of SARS-CoV-2 viral copies this assay can detect is 138 copies/mL. A negative result does not preclude SARS-Cov-2 infection and should not be used as the sole basis for treatment or other patient management decisions. A negative result may occur with  improper specimen collection/handling, submission of specimen other than nasopharyngeal swab, presence of viral mutation(s) within the areas targeted by this assay, and inadequate number of viral copies(<138 copies/mL). A negative result must be combined with clinical observations, patient history, and  epidemiological information. The expected result is Negative.  Fact Sheet for Patients:  EntrepreneurPulse.com.au  Fact Sheet for Healthcare Providers:  IncredibleEmployment.be  This test is no t yet approved or cleared by the [place] FDA and  has been authorized for detection and/or diagnosis of SARS-CoV-2 by FDA under an Emergency Use Authorization (EUA). This EUA will remain  in effect (meaning this test can be used) for the duration of the COVID-19 declaration under Section 564(b)(1) of the Act, 21 U.S.C.section 360bbb-3(b)(1), unless the authorization is terminated  or revoked sooner.       Influenza A by PCR NEGATIVE NEGATIVE Final   Influenza B by PCR NEGATIVE NEGATIVE Final    Comment: (NOTE) The Xpert Xpress SARS-CoV-2/FLU/RSV plus assay is intended as an aid in the diagnosis of influenza from Nasopharyngeal swab specimens and should not be used as a sole basis for treatment. Nasal washings and aspirates are unacceptable for Xpert Xpress SARS-CoV-2/FLU/RSV testing.  Fact Sheet for Patients: EntrepreneurPulse.com.au  Fact Sheet for Healthcare Providers: IncredibleEmployment.be  This test is not yet approved or cleared by the [place] FDA and has been authorized for detection and/or diagnosis of SARS-CoV-2 by FDA under an Emergency Use Authorization (EUA). This EUA will remain in effect (meaning this test can be used) for the duration of the COVID-19 declaration under Section 564(b)(1) of the Act, 21 U.S.C. section 360bbb-3(b)(1), unless the authorization is terminated or revoked.  Performed at [facility], [place] [address], [place], [place] [address]      Time coordinating discharge: 25 minutes  SIGNED: Antonieta Pert, MD  [facility] 03/11/2021, 1:41 PM  If 7PM-7AM, please contact night-coverage [email or web address]

## 2021-03-11 NOTE — TOC Transition Note (Signed)
Transition of Care [facility]) - CM/SW Discharge Note   Patient Details  Name: DEMARLO RIOJAS MRN: [id number] Date of Birth: 03/29/1950  Transition of [facility]) CM/SW Contact:  Tom-Johnson, Renea Ee, RN Phone Number: 03/11/2021, 2:10 PM   Clinical Narrative:     Patient is scheduled for discharge today. Patient is from home with wife and has two daughters. Admitted with AKI. Patient is on disability. Has a renovated handicapped bathroom, cane, and a regular walker.  Home health referral with [facility] and spoke with Amy [phone]) for PT/OT/Aide services with acceptance voiced. Bariatric bedside commode and bariatric rolling walker ordered through [place] and Freda Munro [phone]) to deliver at bedside.  PT ordered a bledsoe brace for Rt knee from ortho tech and to be delivered to patient at bedside. Wife, Santiago Glad to transport at discharge. No further TOC needs noted.   Final next level of care: [place] Barriers to Discharge: Barriers Resolved   Patient Goals and CMS Choice Patient states their goals for this hospitalization and ongoing recovery are:: To return home CMS Medicare.gov Compare Post Acute Care list provided to:: Patient Choice offered to / list presented to : Patient, Spouse  Discharge Placement                       Discharge Plan and Services                DME Arranged: 3-N-1, Walker rolling (Bariatric) DME Agency: [employer] Date DME Agency Contacted: 03/11/21 Time DME Agency Contacted: 80 Representative spoke with at DME Agency: Freda Munro HH Arranged: PT, OT, Nurse's Aide   Date [place]: 03/11/21      Social Determinants of Health (SDOH) Interventions     Readmission Risk Interventions No flowsheet data found.

## 2021-03-11 NOTE — Progress Notes (Signed)
Orthopedic Tech Progress Note Patient Details:  Cameron Moran 1950/06/13 [id number]  Patient ID: Cameron Moran, male   DOB: 1950/09/06, 71 y.o.   MRN: [id number]  [name] 03/11/2021, 2:34 PM Called hanger for stat brace order.

## 2021-03-31 DIAGNOSIS — N179 Acute kidney failure, unspecified: Secondary | ICD-10-CM | POA: Diagnosis not present

## 2021-03-31 DIAGNOSIS — E559 Vitamin D deficiency, unspecified: Secondary | ICD-10-CM | POA: Diagnosis not present

## 2021-03-31 DIAGNOSIS — I10 Essential (primary) hypertension: Secondary | ICD-10-CM | POA: Diagnosis not present

## 2021-04-01 DIAGNOSIS — E782 Mixed hyperlipidemia: Secondary | ICD-10-CM | POA: Diagnosis not present

## 2021-04-01 DIAGNOSIS — E1149 Type 2 diabetes mellitus with other diabetic neurological complication: Secondary | ICD-10-CM | POA: Diagnosis not present

## 2021-04-01 DIAGNOSIS — I10 Essential (primary) hypertension: Secondary | ICD-10-CM | POA: Diagnosis not present

## 2021-04-01 DIAGNOSIS — E1165 Type 2 diabetes mellitus with hyperglycemia: Secondary | ICD-10-CM | POA: Diagnosis not present

## 2021-04-04 DIAGNOSIS — N179 Acute kidney failure, unspecified: Secondary | ICD-10-CM | POA: Diagnosis not present

## 2021-04-04 DIAGNOSIS — I10 Essential (primary) hypertension: Secondary | ICD-10-CM | POA: Diagnosis not present

## 2021-04-08 ENCOUNTER — Other Ambulatory Visit: Payer: Self-pay | Admitting: Urology

## 2021-04-08 ENCOUNTER — Telehealth: Payer: Self-pay | Admitting: *Deleted

## 2021-04-08 NOTE — Telephone Encounter (Signed)
RETURNED PATIENT'S WIFE'S PHONE CALL, SPOKE WITH PATIENT'S WIFE- KAREN 

## 2021-04-12 DIAGNOSIS — E875 Hyperkalemia: Secondary | ICD-10-CM | POA: Diagnosis not present

## 2021-04-12 NOTE — Progress Notes (Signed)
Pt chart reviewed prior to surgery scheduled on 05-24-2021 by Dr Cain Sieve if pt meets guidelines for ambulatory surgery center.  Pt has BMI great than 55, per guidelines pt is not a candidate for [facility]. Called and left message for Pam, OR scheduler for Dr Cain Sieve, informed her pt will need to be moved to main OR and why.

## 2021-04-26 DIAGNOSIS — I10 Essential (primary) hypertension: Secondary | ICD-10-CM | POA: Diagnosis not present

## 2021-04-26 DIAGNOSIS — E039 Hypothyroidism, unspecified: Secondary | ICD-10-CM | POA: Diagnosis not present

## 2021-04-26 DIAGNOSIS — E782 Mixed hyperlipidemia: Secondary | ICD-10-CM | POA: Diagnosis not present

## 2021-04-26 DIAGNOSIS — E1165 Type 2 diabetes mellitus with hyperglycemia: Secondary | ICD-10-CM | POA: Diagnosis not present

## 2021-04-27 NOTE — Progress Notes (Signed)
Anesthesia Review:  PCP: Cardiologist : Chest x-ray : 03/09/21  EKG : 03/10/21  Echo : 2020  Stress test: Cardiac Cath :  Activity level:  Sleep Study/ CPAP : Fasting Blood Sugar :      / Checks Blood Sugar -- times a day:   Blood Thinner/ Instructions /Last Dose: ASA / Instructions/ Last Dose :

## 2021-05-02 NOTE — Progress Notes (Addendum)
DUE TO COVID-19 ONLY ONE VISITOR IS ALLOWED TO COME WITH YOU AND STAY IN THE WAITING ROOM ONLY DURING PRE OP AND PROCEDURE DAY OF SURGERY.  2 VISITOR  MAY VISIT WITH YOU AFTER SURGERY IN YOUR PRIVATE ROOM DURING VISITING HOURS ONLY! YOU MAY HAVE ONE PERSON SPEND THE NITE WITH YOU IN YOUR ROOM AFTER SURGERY.      Your procedure is scheduled on:    05/24/21   Report to [facility] Main  Entrance   Report to admitting at     0915             AM DO NOT BRING INSURANCE CARD, PICTURE ID OR WALLET DAY OF SURGERY.      Call this number if you have problems the morning of surgery [phone]    REMEMBER: NO  SOLID FOODS , CANDY, GUM OR MINTS AFTER MIDNITE THE NITE BEFORE SURGERY .       Marland Kitchen CLEAR LIQUIDS UNTIL    0830 am              DAY OF SURGERY.     .     Fleets enema nite before surgery.    CLEAR LIQUID DIET   Foods Allowed      WATER BLACK COFFEE ( SUGAR OK, NO MILK, CREAM OR CREAMER) REGULAR AND DECAF  TEA ( SUGAR OK NO MILK, CREAM, OR CREAMER) REGULAR AND DECAF  PLAIN JELLO ( NO RED)  FRUIT ICES ( NO RED, NO FRUIT PULP)  POPSICLES ( NO RED)  JUICE- APPLE, WHITE GRAPE AND WHITE CRANBERRY  SPORT DRINK LIKE GATORADE ( NO RED)  CLEAR BROTH ( VEGETABLE , CHICKEN OR BEEF)                                                                     BRUSH YOUR TEETH MORNING OF SURGERY AND RINSE YOUR MOUTH OUT, NO CHEWING GUM CANDY OR MINTS.     Take these medicines the morning of surgery with A SIP OF WATER:  inhalers as usual and bring,  wellbutrin, citalopram, levothyroxine    DO NOT TAKE ANY DIABETIC MEDICATIONS DAY OF YOUR SURGERY                               You may not have any metal on your body including hair pins and              piercings  Do not wear jewelry, make-up, lotions, powders or perfumes, deodorant             Do not wear nail polish on your fingernails.              IF YOU ARE A MALE AND WANT TO SHAVE UNDER ARMS OR LEGS PRIOR TO SURGERY YOU MUST DO SO AT  LEAST 48 HOURS PRIOR TO SURGERY.              Men may shave face and neck.   Do not bring valuables to the hospital. [facility] IS NOT             RESPONSIBLE   FOR VALUABLES.  Contacts, dentures or bridgework may not be worn  into surgery.  Leave suitcase in the car. After surgery it may be brought to your room.     Patients discharged the day of surgery will not be allowed to drive home. IF YOU ARE HAVING SURGERY AND GOING HOME THE SAME DAY, YOU MUST HAVE AN ADULT TO DRIVE YOU HOME AND BE WITH YOU FOR 24 HOURS. YOU MAY GO HOME BY TAXI OR UBER OR ORTHERWISE, BUT AN ADULT MUST ACCOMPANY YOU HOME AND STAY WITH YOU FOR 24 HOURS.                Please read over the following fact sheets you were given: _____________________________________________________________________  [facility] - Preparing for Surgery Before surgery, you can play an important role.  Because skin is not sterile, your skin needs to be as free of germs as possible.  You can reduce the number of germs on your skin by washing with CHG (chlorahexidine gluconate) soap before surgery.  CHG is an antiseptic cleaner which kills germs and bonds with the skin to continue killing germs even after washing. Please DO NOT use if you have an allergy to CHG or antibacterial soaps.  If your skin becomes reddened/irritated stop using the CHG and inform your nurse when you arrive at Short Stay. Do not shave (including legs and underarms) for at least 48 hours prior to the first CHG shower.  You may shave your face/neck. Please follow these instructions carefully:  1.  Shower with CHG Soap the night before surgery and the  morning of Surgery.  2.  If you choose to wash your hair, wash your hair first as usual with your  normal  shampoo.  3.  After you shampoo, rinse your hair and body thoroughly to remove the  shampoo.                           4.  Use CHG as you would any other liquid soap.  You can apply chg directly  to the skin and wash                        Gently with a scrungie or clean washcloth.  5.  Apply the CHG Soap to your body ONLY FROM THE NECK DOWN.   Do not use on face/ open                           Wound or open sores. Avoid contact with eyes, ears mouth and genitals (private parts).                       Wash face,  Genitals (private parts) with your normal soap.             6.  Wash thoroughly, paying special attention to the area where your surgery  will be performed.  7.  Thoroughly rinse your body with warm water from the neck down.  8.  DO NOT shower/wash with your normal soap after using and rinsing off  the CHG Soap.                9.  Pat yourself dry with a clean towel.            10.  Wear clean pajamas.            11.  Place clean sheets on your bed the night  of your first shower and do not  sleep with pets. Day of Surgery : Do not apply any lotions/deodorants the morning of surgery.  Please wear clean clothes to the hospital/surgery center.  FAILURE TO FOLLOW THESE INSTRUCTIONS MAY RESULT IN THE CANCELLATION OF YOUR SURGERY PATIENT SIGNATURE_________________________________  NURSE SIGNATURE__________________________________  ________________________________________________________________________

## 2021-05-04 ENCOUNTER — Encounter (HOSPITAL_COMMUNITY)
Admission: RE | Admit: 2021-05-04 | Discharge: 2021-05-04 | Disposition: A | Discharge status: Home / Self Care | Payer: Medicare Other | Source: Ambulatory Visit | Attending: Anesthesiology | Admitting: Anesthesiology

## 2021-05-04 ENCOUNTER — Other Ambulatory Visit: Payer: Self-pay

## 2021-05-04 ENCOUNTER — Encounter (HOSPITAL_COMMUNITY): Payer: Self-pay | Admitting: Urology

## 2021-05-04 DIAGNOSIS — E119 Type 2 diabetes mellitus without complications: Secondary | ICD-10-CM | POA: Diagnosis not present

## 2021-05-04 DIAGNOSIS — Z01818 Encounter for other preprocedural examination: Secondary | ICD-10-CM | POA: Diagnosis not present

## 2021-05-04 DIAGNOSIS — C61 Malignant neoplasm of prostate: Secondary | ICD-10-CM | POA: Diagnosis not present

## 2021-05-10 ENCOUNTER — Other Ambulatory Visit: Payer: Self-pay

## 2021-05-10 ENCOUNTER — Encounter (HOSPITAL_COMMUNITY)
Admission: RE | Admit: 2021-05-10 | Discharge: 2021-05-10 | Disposition: A | Discharge status: Home / Self Care | Payer: Medicare Other | Source: Ambulatory Visit | Attending: Urology | Admitting: Urology

## 2021-05-10 DIAGNOSIS — E119 Type 2 diabetes mellitus without complications: Secondary | ICD-10-CM | POA: Diagnosis not present

## 2021-05-10 DIAGNOSIS — Z01818 Encounter for other preprocedural examination: Secondary | ICD-10-CM | POA: Diagnosis not present

## 2021-05-10 DIAGNOSIS — C61 Malignant neoplasm of prostate: Secondary | ICD-10-CM

## 2021-05-10 LAB — BASIC METABOLIC PANEL
Anion gap: 7 (ref 5–15)
BUN: 17 mg/dL (ref 8–23)
CO2: 26 mmol/L (ref 22–32)
Calcium: 8.8 mg/dL — ABNORMAL LOW (ref 8.9–10.3)
Chloride: 106 mmol/L (ref 98–111)
Creatinine, Ser: 1.1 mg/dL (ref 0.61–1.24)
GFR, Estimated: >60 mL/min (ref >60)
Glucose, Bld: 92 mg/dL (ref 70–99)
Potassium: 5.4 mmol/L — ABNORMAL HIGH (ref 3.5–5.1)
Sodium: 139 mmol/L (ref 135–145)

## 2021-05-10 LAB — CBC
HCT: 34.6 % — ABNORMAL LOW (ref 39.0–52.0)
Hemoglobin: 11.4 g/dL — ABNORMAL LOW (ref 13.0–17.0)
MCH: 32.3 pg (ref 26.0–34.0)
MCHC: 32.9 g/dL (ref 30.0–36.0)
MCV: 98 fL (ref 80.0–100.0)
Platelets: 206 10*3/uL (ref 150–400)
RBC: 3.53 MIL/uL — ABNORMAL LOW (ref 4.22–5.81)
RDW: 13.3 % (ref 11.5–15.5)
WBC: 5.4 10*3/uL (ref 4.0–10.5)
nRBC: 0 % (ref 0.0–0.2)

## 2021-05-10 LAB — HEMOGLOBIN A1C
Hgb A1c MFr Bld: 5.5 % (ref 4.8–5.6)
Mean Plasma Glucose: 111.15 mg/dL

## 2021-05-10 LAB — GLUCOSE, CAPILLARY: Glucose-Capillary: 110 mg/dL — ABNORMAL HIGH (ref 70–99)

## 2021-05-24 ENCOUNTER — Encounter (HOSPITAL_COMMUNITY): Payer: Self-pay | Admitting: Urology

## 2021-05-24 ENCOUNTER — Ambulatory Visit (HOSPITAL_COMMUNITY): Payer: Medicare Other | Admitting: Physician Assistant

## 2021-05-24 ENCOUNTER — Ambulatory Visit (HOSPITAL_BASED_OUTPATIENT_CLINIC_OR_DEPARTMENT_OTHER): Payer: Medicare Other | Admitting: Anesthesiology

## 2021-05-24 ENCOUNTER — Ambulatory Visit (HOSPITAL_COMMUNITY)
Admission: RE | Admit: 2021-05-24 | Discharge: 2021-05-24 | Disposition: A | Discharge status: Home / Self Care | Payer: Medicare Other | Attending: Urology | Admitting: Urology

## 2021-05-24 ENCOUNTER — Encounter (HOSPITAL_COMMUNITY)
Admission: RE | Disposition: A | Discharge status: Home / Self Care | Payer: Self-pay | Source: Home / Self Care | Attending: Urology

## 2021-05-24 DIAGNOSIS — I2510 Atherosclerotic heart disease of native coronary artery without angina pectoris: Secondary | ICD-10-CM | POA: Insufficient documentation

## 2021-05-24 DIAGNOSIS — I10 Essential (primary) hypertension: Secondary | ICD-10-CM | POA: Insufficient documentation

## 2021-05-24 DIAGNOSIS — E119 Type 2 diabetes mellitus without complications: Secondary | ICD-10-CM

## 2021-05-24 DIAGNOSIS — Z6841 Body Mass Index (BMI) 40.0 and over, adult: Secondary | ICD-10-CM | POA: Diagnosis not present

## 2021-05-24 DIAGNOSIS — E039 Hypothyroidism, unspecified: Secondary | ICD-10-CM | POA: Diagnosis not present

## 2021-05-24 DIAGNOSIS — C61 Malignant neoplasm of prostate: Secondary | ICD-10-CM

## 2021-05-24 HISTORY — DX: Anxiety disorder, unspecified: F41.9

## 2021-05-24 HISTORY — PX: GOLD SEED IMPLANT: SHX6343

## 2021-05-24 HISTORY — DX: Hypothyroidism, unspecified: E03.9

## 2021-05-24 HISTORY — PX: SPACE OAR INSTILLATION: SHX6769

## 2021-05-24 HISTORY — DX: Unspecified osteoarthritis, unspecified site: M19.90

## 2021-05-24 HISTORY — DX: Malignant (primary) neoplasm, unspecified: C80.1

## 2021-05-24 LAB — GLUCOSE, CAPILLARY
Glucose-Capillary: 88 mg/dL (ref 70–99)
Glucose-Capillary: 81 mg/dL (ref 70–99)

## 2021-05-24 SURGERY — INSERTION, GOLD SEEDS
Anesthesia: General | Site: Prostate

## 2021-05-24 MED ORDER — FENTANYL CITRATE (PF) 100 MCG/2ML IJ SOLN
INTRAMUSCULAR | Status: AC
Start: 2021-05-24 — End: ?
  Filled 2021-05-24: qty 2

## 2021-05-24 MED ORDER — OXYCODONE HCL 5 MG/5ML PO SOLN: 5.0000 mg | Freq: Once | ORAL | Status: DC | PRN

## 2021-05-24 MED ORDER — PROPOFOL 10 MG/ML IV BOLUS
INTRAVENOUS | Status: DC | PRN
  Administered 2021-05-24: 200 mg via INTRAVENOUS

## 2021-05-24 MED ORDER — OXYCODONE HCL 5 MG PO TABS: 5.0000 mg | ORAL_TABLET | Freq: Once | ORAL | Status: DC | PRN

## 2021-05-24 MED ORDER — AMISULPRIDE (ANTIEMETIC) 5 MG/2ML IV SOLN: 10.0000 mg | Freq: Once | INTRAVENOUS | Status: DC | PRN

## 2021-05-24 MED ORDER — CHLORHEXIDINE GLUCONATE 0.12 % MT SOLN
15.0000 mL | Freq: Once | OROMUCOSAL | Status: AC
  Administered 2021-05-24: 15 mL via OROMUCOSAL

## 2021-05-24 MED ORDER — STERILE WATER FOR IRRIGATION IR SOLN
Status: DC | PRN
  Administered 2021-05-24: 500 mL

## 2021-05-24 MED ORDER — ORAL CARE MOUTH RINSE: 15.0000 mL | Freq: Once | OROMUCOSAL | Status: AC

## 2021-05-24 MED ORDER — SODIUM CHLORIDE FLUSH 0.9 % IV SOLN
INTRAVENOUS | Status: DC | PRN
  Administered 2021-05-24: 3 mL

## 2021-05-24 MED ORDER — PHENYLEPHRINE HCL-NACL 20-0.9 MG/250ML-% IV SOLN
INTRAVENOUS | Status: DC | PRN
  Administered 2021-05-24: 25 ug/min via INTRAVENOUS

## 2021-05-24 MED ORDER — FLEET ENEMA 7-19 GM/118ML RE ENEM: 1.0000 | ENEMA | Freq: Once | RECTAL | Status: DC

## 2021-05-24 MED ORDER — GLYCOPYRROLATE 0.2 MG/ML IJ SOLN
INTRAMUSCULAR | Status: AC
  Filled 2021-05-24: qty 2

## 2021-05-24 MED ORDER — PROMETHAZINE HCL 25 MG/ML IJ SOLN: 6.2500 mg | INTRAMUSCULAR | Status: DC | PRN

## 2021-05-24 MED ORDER — DEXMEDETOMIDINE (PRECEDEX) IN NS 20 MCG/5ML (4 MCG/ML) IV SYRINGE
PREFILLED_SYRINGE | INTRAVENOUS | Status: DC | PRN
  Administered 2021-05-24: 8 ug via INTRAVENOUS

## 2021-05-24 MED ORDER — ONDANSETRON HCL 4 MG/2ML IJ SOLN
INTRAMUSCULAR | Status: DC | PRN
  Administered 2021-05-24: 4 mg via INTRAVENOUS

## 2021-05-24 MED ORDER — LIDOCAINE HCL (CARDIAC) PF 100 MG/5ML IV SOSY
PREFILLED_SYRINGE | INTRAVENOUS | Status: DC | PRN
  Administered 2021-05-24: 80 mg via INTRAVENOUS

## 2021-05-24 MED ORDER — LACTATED RINGERS IV SOLN: INTRAVENOUS | Status: DC

## 2021-05-24 MED ORDER — CIPROFLOXACIN IN D5W 400 MG/200ML IV SOLN
400.0000 mg | INTRAVENOUS | Status: AC
  Administered 2021-05-24: 400 mg via INTRAVENOUS
  Filled 2021-05-24: qty 200

## 2021-05-24 MED ORDER — HYDROMORPHONE HCL 1 MG/ML IJ SOLN: 0.2500 mg | INTRAMUSCULAR | Status: DC | PRN

## 2021-05-24 MED ORDER — FENTANYL CITRATE (PF) 100 MCG/2ML IJ SOLN
INTRAMUSCULAR | Status: DC | PRN
Start: 2021-05-24 — End: 2021-05-24
  Administered 2021-05-24 (×4): 25 ug via INTRAVENOUS

## 2021-05-24 SURGICAL SUPPLY — 31 items
BAG COUNTER SPONGE SURGICOUNT (BAG) IMPLANT
BAG SPNG CNTER NS LX DISP (BAG)
COVER SURGICAL LIGHT HANDLE (MISCELLANEOUS) ×2 IMPLANT
COVER TRANSDUCER ULTRASND (DRAPES) ×1 IMPLANT
DRSG TEGADERM 4X4.75 (GAUZE/BANDAGES/DRESSINGS) ×1 IMPLANT
DRSG TEGADERM 8X12 (GAUZE/BANDAGES/DRESSINGS) ×4 IMPLANT
DRSG TELFA 3X8 NADH (GAUZE/BANDAGES/DRESSINGS) IMPLANT
GAUZE SPONGE 4X4 12PLY STRL (GAUZE/BANDAGES/DRESSINGS) ×1 IMPLANT
GLOVE SURG ENC MOIS LTX SZ7.5 (GLOVE) ×3 IMPLANT
GLOVE SURG NEOPR MICRO LF SZ8 (GLOVE) ×3 IMPLANT
GLOVE SURG POLYISO LF SZ6.5 (GLOVE) ×1 IMPLANT
GLOVE SURG UNDER POLY LF SZ6.5 (GLOVE) ×1 IMPLANT
GOWN STRL REUS W/ TWL LRG LVL3 (GOWN DISPOSABLE) IMPLANT
GOWN STRL REUS W/TWL LRG LVL3 (GOWN DISPOSABLE) ×3
IMPL SPACEOAR VUE SYSTEM (Spacer) IMPLANT
IMPLANT SPACEOAR VUE SYSTEM (Spacer) ×3 IMPLANT
MARKER GOLD PRELOAD 1.2X3 (Urological Implant) ×2 IMPLANT
NDL SAFETY ECLIPSE 18X1.5 (NEEDLE) IMPLANT
NDL SPNL 22GX7 QUINCKE BK (NEEDLE) IMPLANT
NEEDLE HYPO 18GX1.5 SHARP (NEEDLE)
NEEDLE SPNL 22GX7 QUINCKE BK (NEEDLE) IMPLANT
PACK CYSTO (CUSTOM PROCEDURE TRAY) ×3 IMPLANT
PAD DRESSING TELFA 3X8 NADH (GAUZE/BANDAGES/DRESSINGS) ×2 IMPLANT
PENCIL SMOKE EVACUATOR (MISCELLANEOUS) IMPLANT
SEED GOLD PRELOAD 1.2X3 (Urological Implant) ×9 IMPLANT
SURGILUBE 2OZ TUBE FLIPTOP (MISCELLANEOUS) ×3 IMPLANT
SYR 20ML LL LF (SYRINGE) IMPLANT
TOWEL OR 17X26 10 PK STRL BLUE (TOWEL DISPOSABLE) ×2 IMPLANT
TUBING CONNECTING 10 (TUBING) IMPLANT
UNDERPAD 30X36 HEAVY ABSORB (UNDERPADS AND DIAPERS) ×3 IMPLANT
WATER STERILE IRR 500ML POUR (IV SOLUTION) ×3 IMPLANT

## 2021-05-24 NOTE — Anesthesia Procedure Notes (Signed)
Procedure Name: LMA Insertion ?Date/Time: 05/24/2021 11:56 AM ?Performed by: Lavina Hamman, CRNA ?Pre-anesthesia Checklist: Patient identified, Emergency Drugs available, Suction available and Patient being monitored ?Patient Re-evaluated:Patient Re-evaluated prior to induction ?Oxygen Delivery Method: Circle System Utilized ?Preoxygenation: Pre-oxygenation with 100% oxygen ?Induction Type: IV induction ?Ventilation: Mask ventilation without difficulty ?LMA: LMA with gastric port inserted ?LMA Size: 4.0 ?Number of attempts: 1 ?Airway Equipment and Method: Bite block ?Placement Confirmation: positive ETCO2 ?Tube secured with: Tape ?Dental Injury: Teeth and Oropharynx as per pre-operative assessment  ? ? ? ? ?

## 2021-05-24 NOTE — Op Note (Signed)
Preoperative diagnosis: Clinically localized adenocarcinoma of the prostate  ? ?Postoperative diagnosis: Clinically localized adenocarcinoma of the prostate ? ?Procedure: 1) Placement of fiducial markers into prostate ?                   2) Insertion of SpaceOAR hydrogel  ? ?Surgeon:Luke Lakisha Peyser. M.D. ? ?Anesthesia: General ? ?EBL: Minimal ? ?Complications: None ? ?Indication: Cameron Moran is a 71 y.o. gentleman with clinically localized prostate cancer. After discussing management options for treatment, he elected to proceed with radiotherapy. He presents today for the above procedures. The potential risks, complications, alternative options, and expected recovery course have been discussed in detail with the patient and he has provided informed consent to proceed. ? ?Description of procedure: The patient was administered preoperative antibiotics, placed in the dorsal lithotomy position, and prepped and draped in the usual sterile fashion. Next, transrectal ultrasonography was utilized to visualize the prostate. Three gold fiducial markers were then placed into the prostate via transperineal needles under ultrasound guidance at the left apex, left base, and right mid gland under direct ultrasound guidance. A site in the midline was then selected on the perineum for placement of an 18 g needle with saline. The needle was advanced above the rectum and below Denonvillier's fascia to the mid gland and confirmed to be in the midline on transverse imaging. One cc of saline was injected confirming appropriate expansion of this space. A total of 5 cc of saline was then injected to open the space further bilaterally. The saline syringe was then removed and the SpaceOAR hydrogel was injected with good distribution bilaterally. He tolerated the procedure well and without complications. He was given a voiding trial prior to discharge from the PACU. ? ?

## 2021-05-24 NOTE — Discharge Instructions (Addendum)
You should avoid strenuous activities today but may resume all normal activities tomorrow.  2.   You can take Tylenol as needed for any pain or discomfort.  3.    Follow up with your radiation oncologist for your simulation appointment as scheduled.  If this is not currently scheduled or you do not know the date/time for that appointment, please contact the radiation oncology office to confirm.  

## 2021-05-24 NOTE — Transfer of Care (Signed)
Immediate Anesthesia Transfer of Care Note ? ?Patient: Cameron Moran ? ?Procedure(s) Performed: Procedure(s): ?GOLD SEED IMPLANT (N/A) ?SPACE OAR INSTILLATION (N/A) ? ?Patient Location: PACU ? ?Anesthesia Type:General ? ?Level of Consciousness:  sedated, patient cooperative and responds to stimulation ? ?Airway & Oxygen Therapy:Patient Spontanous Breathing and Patient connected to face mask oxgen ? ?Post-op Assessment:  Report given to PACU RN and Post -op Vital signs reviewed and stable ? ?Post vital signs:  Reviewed and stable ? ?Last Vitals:  ?Vitals:  ? 05/24/21 0939  ?BP: 116/72  ?Pulse: 70  ?Resp: 18  ?Temp: 36.7 ?C  ?SpO2: 99%  ? ? ?Complications: No apparent anesthesia complications ? ?

## 2021-05-24 NOTE — Anesthesia Preprocedure Evaluation (Signed)
Anesthesia Evaluation  ?Patient identified by MRN, date of birth, ID band ?Patient awake ? ? ? ?Reviewed: ?Allergy & Precautions, NPO status , Patient's Chart, lab work & pertinent test results ? ?Airway ?Mallampati: II ? ?TM Distance: >3 FB ?Neck ROM: Full ? ? ? Dental ?no notable dental hx. ? ?  ?Pulmonary ?asthma , sleep apnea , former smoker,  ?  ?Pulmonary exam normal ?breath sounds clear to auscultation ? ? ? ? ? ? Cardiovascular ?hypertension, Pt. on medications ?+ CAD  ?Normal cardiovascular exam ?Rhythm:Regular Rate:Normal ? ? ?  ?Neuro/Psych ?Anxiety Depression negative neurological ROS ? negative psych ROS  ? GI/Hepatic ?negative GI ROS, Neg liver ROS,   ?Endo/Other  ?diabetes, Type 2Hypothyroidism Morbid obesity ? Renal/GU ?Renal disease  ?negative genitourinary ?  ?Musculoskeletal ? ?(+) Arthritis , Osteoarthritis,   ? Abdominal ?(+) + obese,   ?Peds ?negative pediatric ROS ?(+)  Hematology ?negative hematology ROS ?(+)   ?Anesthesia Other Findings ?Prostate Cancer ? Reproductive/Obstetrics ?negative OB ROS ? ?  ? ? ? ? ? ? ? ? ? ? ? ? ? ?  ?  ? ? ? ? ? ? ? ? ?Anesthesia Physical ?Anesthesia Plan ? ?ASA: 3 ? ?Anesthesia Plan: General  ? ?Post-op Pain Management: Minimal or no pain anticipated  ? ?Induction: Intravenous ? ?PONV Risk Score and Plan: 2 and Ondansetron, Midazolam and Treatment may vary due to age or medical condition ? ?Airway Management Planned: LMA ? ?Additional Equipment:  ? ?Intra-op Plan:  ? ?Post-operative Plan: Extubation in OR ? ?Informed Consent: I have reviewed the patients History and Physical, chart, labs and discussed the procedure including the risks, benefits and alternatives for the proposed anesthesia with the patient or authorized representative who has indicated his/her understanding and acceptance.  ? ? ? ?Dental advisory given ? ?Plan Discussed with: CRNA ? ?Anesthesia Plan Comments:   ? ? ? ? ? ? ?Anesthesia Quick Evaluation ? ?

## 2021-05-24 NOTE — Anesthesia Postprocedure Evaluation (Signed)
Anesthesia Post Note ? ?Patient: DONTAVIOUS EMILY ? ?Procedure(s) Performed: GOLD SEED IMPLANT (Prostate) ?SPACE OAR INSTILLATION ? ?  ? ?Patient location during evaluation: PACU ?Anesthesia Type: General ?Level of consciousness: awake and alert ?Pain management: pain level controlled ?Vital Signs Assessment: post-procedure vital signs reviewed and stable ?Respiratory status: spontaneous breathing, nonlabored ventilation and respiratory function stable ?Cardiovascular status: blood pressure returned to baseline and stable ?Postop Assessment: no apparent nausea or vomiting ?Anesthetic complications: no ? ? ?No notable events documented. ? ?Last Vitals:  ?Vitals:  ? 05/24/21 1315 05/24/21 1345  ?BP: (!) 143/77 130/77  ?Pulse: 68 75  ?Resp: 18 16  ?Temp:  36.6 ?C  ?SpO2: 94% 95%  ?  ?Last Pain:  ?Vitals:  ? 05/24/21 1345  ?TempSrc:   ?PainSc: 0-No pain  ? ? ?  ?  ?  ?  ?  ?  ? ?Lynda Rainwater ? ? ? ? ?
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
## 2021-05-25 ENCOUNTER — Encounter (HOSPITAL_COMMUNITY): Payer: Self-pay | Admitting: Urology

## 2021-05-27 ENCOUNTER — Ambulatory Visit: Payer: Medicare Other | Admitting: Radiation Oncology

## 2021-05-29 NOTE — Progress Notes (Incomplete)
?  Radiation Oncology         [phone] ?________________________________ ? ?Name: Cameron Moran MRN: [id number]  ?Date: 06/02/2021  DOB: 15-May-1950 ? ?SIMULATION AND TREATMENT PLANNING NOTE ? ?  ICD-10-CM   ?1. Malignant neoplasm of prostate ([place])  C61   ?  ? ? ?DIAGNOSIS:  71 y.o. 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 ? ?NARRATIVE:  The patient was brought to the [facility].  Identity was confirmed.  All relevant records and images related to the planned course of therapy were reviewed.  The patient freely provided informed written consent to proceed with treatment after reviewing the details related to the planned course of therapy. The consent form was witnessed and verified by the simulation staff.  Then, the patient was set-up in a stable reproducible supine position for radiation therapy.  A vacuum lock pillow device was custom fabricated to position his legs in a reproducible immobilized position.  Then, I performed a urethrogram under sterile conditions to identify the prostatic bed.  CT images were obtained.  Surface markings were placed.  The CT images were loaded into the planning software.  Then the prostate bed target, pelvic lymph node target and avoidance structures including the rectum, bladder, bowel and hips were contoured.  Treatment planning then occurred.  The radiation prescription was entered and confirmed.  A total of one complex treatment devices were fabricated. I have requested : Intensity Modulated Radiotherapy (IMRT) is medically necessary for this case for the following reason:  Rectal sparing.. ? ?PLAN:  The patient will receive 45 Gy in 25 fractions of 1.8 Gy, followed by a boost to the prostate to a total dose of 75 Gy with 15 additional fractions of 2 Gy. ? ? ?________________________________ ? ?Sheral Apley Tammi Klippel, M.D. ? ?

## 2021-06-01 ENCOUNTER — Telehealth: Payer: Self-pay | Admitting: *Deleted

## 2021-06-01 NOTE — Telephone Encounter (Signed)
CALLED PATIENT TO REMIND OF SIM APPT. FOR 06-02-21- ARRIVAL TIME- 2:45 PM @ [place], PATIENT INFORMED TO ARRIVE WITH A FULL BLADDER AND AN EMPTY BOWEL, SPOKE WITH PATIENT AND HE VERIFIED UNDERSTANDING THIS APPT. ?

## 2021-06-02 ENCOUNTER — Ambulatory Visit: Payer: Medicare Other | Admitting: Radiation Oncology

## 2021-06-02 DIAGNOSIS — C61 Malignant neoplasm of prostate: Secondary | ICD-10-CM

## 2021-06-07 ENCOUNTER — Telehealth: Payer: Self-pay | Admitting: *Deleted

## 2021-06-07 NOTE — Telephone Encounter (Signed)
CALLED PATIENT TO REMIND OF SIM APPT. FOR 06-08-21- ARRIVAL TIME- 1:45 PM @ [facility], PATIENT INFORMED TO ARRIVE WITH A FULL BLADDER AND AN EMPTY BOWEL, SPOKE WITH PATIENT'S WIFE KAREN AND SHE IS AWARE OF THIS APPT. ?

## 2021-06-08 ENCOUNTER — Ambulatory Visit
Admission: RE | Admit: 2021-06-08 | Discharge: 2021-06-08 | Disposition: A | Discharge status: Home / Self Care | Payer: Medicare Other | Source: Ambulatory Visit | Attending: Radiation Oncology | Admitting: Radiation Oncology

## 2021-06-08 ENCOUNTER — Other Ambulatory Visit: Payer: Self-pay

## 2021-06-08 DIAGNOSIS — C61 Malignant neoplasm of prostate: Secondary | ICD-10-CM | POA: Diagnosis not present

## 2021-06-08 DIAGNOSIS — Z191 Hormone sensitive malignancy status: Secondary | ICD-10-CM | POA: Diagnosis not present

## 2021-06-09 NOTE — Progress Notes (Signed)
?  Radiation Oncology         [phone] ?________________________________ ? ?Name: Cameron Moran MRN: [id number]  ?Date: 06/08/2021  DOB: [birth year] ? ?SIMULATION AND TREATMENT PLANNING NOTE ? ?  ICD-10-CM   ?1. Malignant neoplasm of prostate ([place])  C61   ?  ? ? ?DIAGNOSIS:  71 y.o. gentleman with Stage T3 adenocarcinoma of the prostate with Gleason score of 4+5, and PSA of 4.67 ? ?NARRATIVE:  The patient was brought to the [place].  Identity was confirmed.  All relevant records and images related to the planned course of therapy were reviewed.  The patient freely provided informed written consent to proceed with treatment after reviewing the details related to the planned course of therapy. The consent form was witnessed and verified by the simulation staff.  Then, the patient was set-up in a stable reproducible supine position for radiation therapy.  A vacuum lock pillow device was custom fabricated to position his legs in a reproducible immobilized position.  Then, I performed a urethrogram under sterile conditions to identify the prostatic bed.  CT images were obtained.  Surface markings were placed.  The CT images were loaded into the planning software.  Then the prostate bed target, pelvic lymph node target and avoidance structures including the rectum, bladder, bowel and hips were contoured.  Treatment planning then occurred.  The radiation prescription was entered and confirmed.  A total of one complex treatment devices were fabricated. I have requested : Intensity Modulated Radiotherapy (IMRT) is medically necessary for this case for the following reason:  Rectal sparing.. ? ?PLAN:  The patient will receive 45 Gy in 25 fractions of 1.8 Gy, followed by a boost to the prostate to a total dose of 75 Gy with 15 additional fractions of 2 Gy. ? ? ?________________________________ ? ?Sheral Apley Tammi Klippel, M.D. ? ?

## 2021-06-14 DIAGNOSIS — Z191 Hormone sensitive malignancy status: Secondary | ICD-10-CM | POA: Diagnosis not present

## 2021-06-14 DIAGNOSIS — C61 Malignant neoplasm of prostate: Secondary | ICD-10-CM | POA: Diagnosis not present

## 2021-06-15 ENCOUNTER — Ambulatory Visit: Payer: Medicare Other

## 2021-06-15 DIAGNOSIS — H905 Unspecified sensorineural hearing loss: Secondary | ICD-10-CM | POA: Diagnosis not present

## 2021-06-15 DIAGNOSIS — Z191 Hormone sensitive malignancy status: Secondary | ICD-10-CM | POA: Diagnosis not present

## 2021-06-16 ENCOUNTER — Ambulatory Visit: Payer: Medicare Other

## 2021-06-17 ENCOUNTER — Ambulatory Visit: Payer: Medicare Other

## 2021-06-20 ENCOUNTER — Ambulatory Visit
Admission: RE | Admit: 2021-06-20 | Discharge: 2021-06-20 | Disposition: A | Discharge status: Home / Self Care | Payer: Medicare Other | Source: Ambulatory Visit | Attending: Radiation Oncology | Admitting: Radiation Oncology

## 2021-06-20 ENCOUNTER — Other Ambulatory Visit: Payer: Self-pay

## 2021-06-20 DIAGNOSIS — Z510 Encounter for antineoplastic radiation therapy: Secondary | ICD-10-CM | POA: Insufficient documentation

## 2021-06-20 DIAGNOSIS — R310 Gross hematuria: Secondary | ICD-10-CM | POA: Diagnosis not present

## 2021-06-20 DIAGNOSIS — C61 Malignant neoplasm of prostate: Secondary | ICD-10-CM | POA: Insufficient documentation

## 2021-06-20 DIAGNOSIS — Z191 Hormone sensitive malignancy status: Secondary | ICD-10-CM | POA: Diagnosis not present

## 2021-06-20 DIAGNOSIS — Z79899 Other long term (current) drug therapy: Secondary | ICD-10-CM | POA: Insufficient documentation

## 2021-06-20 LAB — RAD ONC ARIA SESSION SUMMARY
Course Elapsed Days: 0
Plan Fractions Treated to Date: 1
Plan Prescribed Dose Per Fraction: 1.8 Gy
Plan Total Fractions Prescribed: 25
Plan Total Prescribed Dose: 45 Gy
Reference Point Dosage Given to Date: 1.8 Gy
Reference Point Session Dosage Given: 1.8 Gy
Session Number: 1

## 2021-06-21 ENCOUNTER — Inpatient Hospital Stay: Payer: Medicare Other | Attending: Radiation Oncology

## 2021-06-21 ENCOUNTER — Ambulatory Visit
Admission: RE | Admit: 2021-06-21 | Discharge: 2021-06-21 | Disposition: A | Discharge status: Home / Self Care | Payer: Medicare Other | Source: Ambulatory Visit | Attending: Radiation Oncology | Admitting: Radiation Oncology

## 2021-06-21 ENCOUNTER — Other Ambulatory Visit: Payer: Self-pay

## 2021-06-21 DIAGNOSIS — Z79899 Other long term (current) drug therapy: Secondary | ICD-10-CM | POA: Diagnosis not present

## 2021-06-21 DIAGNOSIS — R310 Gross hematuria: Secondary | ICD-10-CM | POA: Diagnosis not present

## 2021-06-21 DIAGNOSIS — Z191 Hormone sensitive malignancy status: Secondary | ICD-10-CM | POA: Diagnosis not present

## 2021-06-21 DIAGNOSIS — Z510 Encounter for antineoplastic radiation therapy: Secondary | ICD-10-CM | POA: Diagnosis not present

## 2021-06-21 LAB — RAD ONC ARIA SESSION SUMMARY
Course Elapsed Days: 1
Plan Fractions Treated to Date: 2
Plan Prescribed Dose Per Fraction: 1.8 Gy
Plan Total Fractions Prescribed: 25
Plan Total Prescribed Dose: 45 Gy
Reference Point Dosage Given to Date: 3.6 Gy
Reference Point Session Dosage Given: 1.8 Gy
Session Number: 2

## 2021-06-22 ENCOUNTER — Other Ambulatory Visit: Payer: Self-pay

## 2021-06-22 ENCOUNTER — Inpatient Hospital Stay: Payer: Medicare Other

## 2021-06-22 ENCOUNTER — Ambulatory Visit
Admission: RE | Admit: 2021-06-22 | Discharge: 2021-06-22 | Disposition: A | Discharge status: Home / Self Care | Payer: Medicare Other | Source: Ambulatory Visit | Attending: Radiation Oncology | Admitting: Radiation Oncology

## 2021-06-22 DIAGNOSIS — Z510 Encounter for antineoplastic radiation therapy: Secondary | ICD-10-CM | POA: Diagnosis not present

## 2021-06-22 DIAGNOSIS — Z191 Hormone sensitive malignancy status: Secondary | ICD-10-CM | POA: Diagnosis not present

## 2021-06-22 DIAGNOSIS — R310 Gross hematuria: Secondary | ICD-10-CM | POA: Diagnosis not present

## 2021-06-22 DIAGNOSIS — Z79899 Other long term (current) drug therapy: Secondary | ICD-10-CM | POA: Diagnosis not present

## 2021-06-22 LAB — RAD ONC ARIA SESSION SUMMARY
Course Elapsed Days: 2
Plan Fractions Treated to Date: 3
Plan Prescribed Dose Per Fraction: 1.8 Gy
Plan Total Fractions Prescribed: 25
Plan Total Prescribed Dose: 45 Gy
Reference Point Dosage Given to Date: 5.4 Gy
Reference Point Session Dosage Given: 1.8 Gy
Session Number: 3

## 2021-06-23 ENCOUNTER — Inpatient Hospital Stay: Payer: Medicare Other

## 2021-06-23 ENCOUNTER — Other Ambulatory Visit: Payer: Self-pay

## 2021-06-23 ENCOUNTER — Ambulatory Visit
Admission: RE | Admit: 2021-06-23 | Discharge: 2021-06-23 | Disposition: A | Discharge status: Home / Self Care | Payer: Medicare Other | Source: Ambulatory Visit | Attending: Radiation Oncology | Admitting: Radiation Oncology

## 2021-06-23 DIAGNOSIS — R310 Gross hematuria: Secondary | ICD-10-CM | POA: Diagnosis not present

## 2021-06-23 DIAGNOSIS — Z191 Hormone sensitive malignancy status: Secondary | ICD-10-CM | POA: Diagnosis not present

## 2021-06-23 DIAGNOSIS — Z510 Encounter for antineoplastic radiation therapy: Secondary | ICD-10-CM | POA: Diagnosis not present

## 2021-06-23 DIAGNOSIS — Z79899 Other long term (current) drug therapy: Secondary | ICD-10-CM | POA: Diagnosis not present

## 2021-06-23 LAB — RAD ONC ARIA SESSION SUMMARY
Course Elapsed Days: 3
Plan Fractions Treated to Date: 4
Plan Prescribed Dose Per Fraction: 1.8 Gy
Plan Total Fractions Prescribed: 25
Plan Total Prescribed Dose: 45 Gy
Reference Point Dosage Given to Date: 7.2 Gy
Reference Point Session Dosage Given: 1.8 Gy
Session Number: 4

## 2021-06-24 ENCOUNTER — Other Ambulatory Visit: Payer: Self-pay

## 2021-06-24 ENCOUNTER — Ambulatory Visit
Admission: RE | Admit: 2021-06-24 | Discharge: 2021-06-24 | Disposition: A | Discharge status: Home / Self Care | Payer: Medicare Other | Source: Ambulatory Visit | Attending: Radiation Oncology | Admitting: Radiation Oncology

## 2021-06-24 ENCOUNTER — Inpatient Hospital Stay: Payer: Medicare Other

## 2021-06-24 DIAGNOSIS — Z79899 Other long term (current) drug therapy: Secondary | ICD-10-CM | POA: Diagnosis not present

## 2021-06-24 DIAGNOSIS — Z191 Hormone sensitive malignancy status: Secondary | ICD-10-CM | POA: Diagnosis not present

## 2021-06-24 DIAGNOSIS — R310 Gross hematuria: Secondary | ICD-10-CM | POA: Diagnosis not present

## 2021-06-24 DIAGNOSIS — Z510 Encounter for antineoplastic radiation therapy: Secondary | ICD-10-CM | POA: Diagnosis not present

## 2021-06-24 LAB — RAD ONC ARIA SESSION SUMMARY
Course Elapsed Days: 4
Plan Fractions Treated to Date: 5
Plan Prescribed Dose Per Fraction: 1.8 Gy
Plan Total Fractions Prescribed: 25
Plan Total Prescribed Dose: 45 Gy
Reference Point Dosage Given to Date: 9 Gy
Reference Point Session Dosage Given: 1.8 Gy
Session Number: 5

## 2021-06-27 ENCOUNTER — Ambulatory Visit
Admission: RE | Admit: 2021-06-27 | Discharge: 2021-06-27 | Disposition: A | Discharge status: Home / Self Care | Payer: Medicare Other | Source: Ambulatory Visit | Attending: Radiation Oncology | Admitting: Radiation Oncology

## 2021-06-27 ENCOUNTER — Other Ambulatory Visit: Payer: Self-pay

## 2021-06-27 ENCOUNTER — Inpatient Hospital Stay: Payer: Medicare Other

## 2021-06-27 DIAGNOSIS — Z510 Encounter for antineoplastic radiation therapy: Secondary | ICD-10-CM | POA: Diagnosis not present

## 2021-06-27 DIAGNOSIS — R310 Gross hematuria: Secondary | ICD-10-CM | POA: Diagnosis not present

## 2021-06-27 DIAGNOSIS — Z79899 Other long term (current) drug therapy: Secondary | ICD-10-CM | POA: Diagnosis not present

## 2021-06-27 DIAGNOSIS — Z191 Hormone sensitive malignancy status: Secondary | ICD-10-CM | POA: Diagnosis not present

## 2021-06-27 LAB — RAD ONC ARIA SESSION SUMMARY
Course Elapsed Days: 7
Plan Fractions Treated to Date: 6
Plan Prescribed Dose Per Fraction: 1.8 Gy
Plan Total Fractions Prescribed: 25
Plan Total Prescribed Dose: 45 Gy
Reference Point Dosage Given to Date: 10.8 Gy
Reference Point Session Dosage Given: 1.8 Gy
Session Number: 6

## 2021-06-28 ENCOUNTER — Other Ambulatory Visit: Payer: Self-pay

## 2021-06-28 ENCOUNTER — Inpatient Hospital Stay: Payer: Medicare Other

## 2021-06-28 ENCOUNTER — Ambulatory Visit
Admission: RE | Admit: 2021-06-28 | Discharge: 2021-06-28 | Disposition: A | Discharge status: Home / Self Care | Payer: Medicare Other | Source: Ambulatory Visit | Attending: Radiation Oncology | Admitting: Radiation Oncology

## 2021-06-28 DIAGNOSIS — R310 Gross hematuria: Secondary | ICD-10-CM | POA: Diagnosis not present

## 2021-06-28 DIAGNOSIS — Z191 Hormone sensitive malignancy status: Secondary | ICD-10-CM | POA: Diagnosis not present

## 2021-06-28 DIAGNOSIS — Z79899 Other long term (current) drug therapy: Secondary | ICD-10-CM | POA: Diagnosis not present

## 2021-06-28 DIAGNOSIS — Z510 Encounter for antineoplastic radiation therapy: Secondary | ICD-10-CM | POA: Diagnosis not present

## 2021-06-28 LAB — RAD ONC ARIA SESSION SUMMARY
Course Elapsed Days: 8
Plan Fractions Treated to Date: 7
Plan Prescribed Dose Per Fraction: 1.8 Gy
Plan Total Fractions Prescribed: 25
Plan Total Prescribed Dose: 45 Gy
Reference Point Dosage Given to Date: 12.6 Gy
Reference Point Session Dosage Given: 1.8 Gy
Session Number: 7

## 2021-06-29 ENCOUNTER — Inpatient Hospital Stay: Payer: Medicare Other

## 2021-06-29 ENCOUNTER — Other Ambulatory Visit: Payer: Self-pay

## 2021-06-29 ENCOUNTER — Ambulatory Visit
Admission: RE | Admit: 2021-06-29 | Discharge: 2021-06-29 | Disposition: A | Discharge status: Home / Self Care | Payer: Medicare Other | Source: Ambulatory Visit | Attending: Radiation Oncology | Admitting: Radiation Oncology

## 2021-06-29 DIAGNOSIS — R310 Gross hematuria: Secondary | ICD-10-CM | POA: Diagnosis not present

## 2021-06-29 DIAGNOSIS — Z79899 Other long term (current) drug therapy: Secondary | ICD-10-CM | POA: Diagnosis not present

## 2021-06-29 DIAGNOSIS — Z191 Hormone sensitive malignancy status: Secondary | ICD-10-CM | POA: Diagnosis not present

## 2021-06-29 DIAGNOSIS — Z510 Encounter for antineoplastic radiation therapy: Secondary | ICD-10-CM | POA: Diagnosis not present

## 2021-06-29 LAB — RAD ONC ARIA SESSION SUMMARY
Course Elapsed Days: 9
Plan Fractions Treated to Date: 8
Plan Prescribed Dose Per Fraction: 1.8 Gy
Plan Total Fractions Prescribed: 25
Plan Total Prescribed Dose: 45 Gy
Reference Point Dosage Given to Date: 14.4 Gy
Reference Point Session Dosage Given: 1.8 Gy
Session Number: 8

## 2021-06-30 ENCOUNTER — Inpatient Hospital Stay: Payer: Medicare Other

## 2021-06-30 ENCOUNTER — Ambulatory Visit
Admission: RE | Admit: 2021-06-30 | Discharge: 2021-06-30 | Disposition: A | Discharge status: Home / Self Care | Payer: Medicare Other | Source: Ambulatory Visit | Attending: Radiation Oncology | Admitting: Radiation Oncology

## 2021-06-30 ENCOUNTER — Other Ambulatory Visit: Payer: Self-pay

## 2021-06-30 DIAGNOSIS — Z79899 Other long term (current) drug therapy: Secondary | ICD-10-CM | POA: Diagnosis not present

## 2021-06-30 DIAGNOSIS — R310 Gross hematuria: Secondary | ICD-10-CM | POA: Diagnosis not present

## 2021-06-30 DIAGNOSIS — Z510 Encounter for antineoplastic radiation therapy: Secondary | ICD-10-CM | POA: Diagnosis not present

## 2021-06-30 DIAGNOSIS — Z191 Hormone sensitive malignancy status: Secondary | ICD-10-CM | POA: Diagnosis not present

## 2021-06-30 LAB — RAD ONC ARIA SESSION SUMMARY
Course Elapsed Days: 10
Plan Fractions Treated to Date: 9
Plan Prescribed Dose Per Fraction: 1.8 Gy
Plan Total Fractions Prescribed: 25
Plan Total Prescribed Dose: 45 Gy
Reference Point Dosage Given to Date: 16.2 Gy
Reference Point Session Dosage Given: 1.8 Gy
Session Number: 9

## 2021-07-01 ENCOUNTER — Other Ambulatory Visit: Payer: Self-pay

## 2021-07-01 ENCOUNTER — Inpatient Hospital Stay: Payer: Medicare Other

## 2021-07-01 ENCOUNTER — Ambulatory Visit
Admission: RE | Admit: 2021-07-01 | Discharge: 2021-07-01 | Disposition: A | Discharge status: Home / Self Care | Payer: Medicare Other | Source: Ambulatory Visit | Attending: Radiation Oncology | Admitting: Radiation Oncology

## 2021-07-01 DIAGNOSIS — R310 Gross hematuria: Secondary | ICD-10-CM | POA: Diagnosis not present

## 2021-07-01 DIAGNOSIS — Z510 Encounter for antineoplastic radiation therapy: Secondary | ICD-10-CM | POA: Diagnosis not present

## 2021-07-01 DIAGNOSIS — Z79899 Other long term (current) drug therapy: Secondary | ICD-10-CM | POA: Diagnosis not present

## 2021-07-01 DIAGNOSIS — Z191 Hormone sensitive malignancy status: Secondary | ICD-10-CM | POA: Diagnosis not present

## 2021-07-01 LAB — RAD ONC ARIA SESSION SUMMARY
Course Elapsed Days: 11
Plan Fractions Treated to Date: 10
Plan Prescribed Dose Per Fraction: 1.8 Gy
Plan Total Fractions Prescribed: 25
Plan Total Prescribed Dose: 45 Gy
Reference Point Dosage Given to Date: 18 Gy
Reference Point Session Dosage Given: 1.8 Gy
Session Number: 10

## 2021-07-04 ENCOUNTER — Inpatient Hospital Stay: Payer: Medicare Other

## 2021-07-04 ENCOUNTER — Ambulatory Visit: Payer: Medicare Other

## 2021-07-05 ENCOUNTER — Inpatient Hospital Stay: Payer: Medicare Other

## 2021-07-05 ENCOUNTER — Other Ambulatory Visit: Payer: Self-pay

## 2021-07-05 ENCOUNTER — Ambulatory Visit
Admission: RE | Admit: 2021-07-05 | Discharge: 2021-07-05 | Disposition: A | Discharge status: Home / Self Care | Payer: Medicare Other | Source: Ambulatory Visit | Attending: Radiation Oncology | Admitting: Radiation Oncology

## 2021-07-05 DIAGNOSIS — R310 Gross hematuria: Secondary | ICD-10-CM | POA: Diagnosis not present

## 2021-07-05 DIAGNOSIS — Z510 Encounter for antineoplastic radiation therapy: Secondary | ICD-10-CM | POA: Diagnosis not present

## 2021-07-05 DIAGNOSIS — Z191 Hormone sensitive malignancy status: Secondary | ICD-10-CM | POA: Diagnosis not present

## 2021-07-05 DIAGNOSIS — Z79899 Other long term (current) drug therapy: Secondary | ICD-10-CM | POA: Diagnosis not present

## 2021-07-05 LAB — RAD ONC ARIA SESSION SUMMARY
Course Elapsed Days: 15
Plan Fractions Treated to Date: 11
Plan Prescribed Dose Per Fraction: 1.8 Gy
Plan Total Fractions Prescribed: 25
Plan Total Prescribed Dose: 45 Gy
Reference Point Dosage Given to Date: 19.8 Gy
Reference Point Session Dosage Given: 1.8 Gy
Session Number: 11

## 2021-07-06 ENCOUNTER — Other Ambulatory Visit: Payer: Self-pay

## 2021-07-06 ENCOUNTER — Ambulatory Visit
Admission: RE | Admit: 2021-07-06 | Discharge: 2021-07-06 | Disposition: A | Discharge status: Home / Self Care | Payer: Medicare Other | Source: Ambulatory Visit | Attending: Radiation Oncology | Admitting: Radiation Oncology

## 2021-07-06 ENCOUNTER — Inpatient Hospital Stay: Payer: Medicare Other

## 2021-07-06 DIAGNOSIS — Z79899 Other long term (current) drug therapy: Secondary | ICD-10-CM | POA: Diagnosis not present

## 2021-07-06 DIAGNOSIS — Z191 Hormone sensitive malignancy status: Secondary | ICD-10-CM | POA: Diagnosis not present

## 2021-07-06 DIAGNOSIS — R310 Gross hematuria: Secondary | ICD-10-CM | POA: Diagnosis not present

## 2021-07-06 DIAGNOSIS — Z510 Encounter for antineoplastic radiation therapy: Secondary | ICD-10-CM | POA: Diagnosis not present

## 2021-07-06 LAB — RAD ONC ARIA SESSION SUMMARY
Course Elapsed Days: 16
Plan Fractions Treated to Date: 12
Plan Prescribed Dose Per Fraction: 1.8 Gy
Plan Total Fractions Prescribed: 25
Plan Total Prescribed Dose: 45 Gy
Reference Point Dosage Given to Date: 21.6 Gy
Reference Point Session Dosage Given: 1.8 Gy
Session Number: 12

## 2021-07-07 ENCOUNTER — Ambulatory Visit
Admission: RE | Admit: 2021-07-07 | Discharge: 2021-07-07 | Disposition: A | Discharge status: Home / Self Care | Payer: Medicare Other | Source: Ambulatory Visit | Attending: Radiation Oncology | Admitting: Radiation Oncology

## 2021-07-07 ENCOUNTER — Other Ambulatory Visit: Payer: Self-pay

## 2021-07-07 ENCOUNTER — Inpatient Hospital Stay: Payer: Medicare Other

## 2021-07-07 DIAGNOSIS — R310 Gross hematuria: Secondary | ICD-10-CM | POA: Diagnosis not present

## 2021-07-07 DIAGNOSIS — Z191 Hormone sensitive malignancy status: Secondary | ICD-10-CM | POA: Diagnosis not present

## 2021-07-07 DIAGNOSIS — Z79899 Other long term (current) drug therapy: Secondary | ICD-10-CM | POA: Diagnosis not present

## 2021-07-07 DIAGNOSIS — Z510 Encounter for antineoplastic radiation therapy: Secondary | ICD-10-CM | POA: Diagnosis not present

## 2021-07-07 LAB — RAD ONC ARIA SESSION SUMMARY
Course Elapsed Days: 17
Plan Fractions Treated to Date: 13
Plan Prescribed Dose Per Fraction: 1.8 Gy
Plan Total Fractions Prescribed: 25
Plan Total Prescribed Dose: 45 Gy
Reference Point Dosage Given to Date: 23.4 Gy
Reference Point Session Dosage Given: 1.8 Gy
Session Number: 13

## 2021-07-08 ENCOUNTER — Other Ambulatory Visit: Payer: Self-pay

## 2021-07-08 ENCOUNTER — Inpatient Hospital Stay: Payer: Medicare Other

## 2021-07-08 ENCOUNTER — Ambulatory Visit
Admission: RE | Admit: 2021-07-08 | Discharge: 2021-07-08 | Disposition: A | Discharge status: Home / Self Care | Payer: Medicare Other | Source: Ambulatory Visit | Attending: Radiation Oncology | Admitting: Radiation Oncology

## 2021-07-08 ENCOUNTER — Telehealth: Payer: Self-pay

## 2021-07-08 DIAGNOSIS — Z191 Hormone sensitive malignancy status: Secondary | ICD-10-CM | POA: Diagnosis not present

## 2021-07-08 DIAGNOSIS — R310 Gross hematuria: Secondary | ICD-10-CM

## 2021-07-08 DIAGNOSIS — Z79899 Other long term (current) drug therapy: Secondary | ICD-10-CM | POA: Diagnosis not present

## 2021-07-08 DIAGNOSIS — G4733 Obstructive sleep apnea (adult) (pediatric): Secondary | ICD-10-CM | POA: Diagnosis not present

## 2021-07-08 DIAGNOSIS — Z510 Encounter for antineoplastic radiation therapy: Secondary | ICD-10-CM | POA: Diagnosis not present

## 2021-07-08 DIAGNOSIS — R319 Hematuria, unspecified: Secondary | ICD-10-CM | POA: Insufficient documentation

## 2021-07-08 LAB — RAD ONC ARIA SESSION SUMMARY
Course Elapsed Days: 18
Plan Fractions Treated to Date: 14
Plan Prescribed Dose Per Fraction: 1.8 Gy
Plan Total Fractions Prescribed: 25
Plan Total Prescribed Dose: 45 Gy
Reference Point Dosage Given to Date: 25.2 Gy
Reference Point Session Dosage Given: 1.8 Gy
Session Number: 14

## 2021-07-08 LAB — URINALYSIS, COMPLETE (UACMP) WITH MICROSCOPIC
Bilirubin Urine: NEGATIVE
Glucose, UA: NEGATIVE mg/dL
Ketones, ur: NEGATIVE mg/dL
Nitrite: NEGATIVE
Protein, ur: NEGATIVE mg/dL
Specific Gravity, Urine: 1.014 (ref 1.005–1.030)
pH: 6 (ref 5.0–8.0)

## 2021-07-08 NOTE — Telephone Encounter (Signed)
Left message for patient to pick up medication at his pharmacy pertaining to urine results per Dr. Tammi Klippel.

## 2021-07-09 ENCOUNTER — Other Ambulatory Visit: Payer: Self-pay | Admitting: Radiation Oncology

## 2021-07-09 LAB — URINE CULTURE: Culture: NO GROWTH

## 2021-07-09 MED ORDER — CIPROFLOXACIN HCL 500 MG PO TABS: 500.0000 mg | ORAL_TABLET | Freq: Two times a day (BID) | ORAL | 0 refills | Status: AC

## 2021-07-11 ENCOUNTER — Ambulatory Visit: Payer: Medicare Other

## 2021-07-11 ENCOUNTER — Inpatient Hospital Stay: Payer: Medicare Other

## 2021-07-11 DIAGNOSIS — R310 Gross hematuria: Secondary | ICD-10-CM

## 2021-07-11 DIAGNOSIS — C61 Malignant neoplasm of prostate: Secondary | ICD-10-CM

## 2021-07-11 NOTE — Progress Notes (Signed)
Please call patient with normal result.  Continue antibiotics just in case.  Thanks. MM

## 2021-07-12 ENCOUNTER — Inpatient Hospital Stay: Payer: Medicare Other

## 2021-07-12 ENCOUNTER — Ambulatory Visit: Payer: Medicare Other

## 2021-07-12 ENCOUNTER — Telehealth: Payer: Self-pay

## 2021-07-12 NOTE — Telephone Encounter (Signed)
Patient was notified of urine results and advised per Dr. Tammi Klippel to continue with antibiotics until completed.

## 2021-07-13 ENCOUNTER — Ambulatory Visit: Payer: Medicare Other

## 2021-07-13 ENCOUNTER — Inpatient Hospital Stay: Payer: Medicare Other

## 2021-07-14 ENCOUNTER — Ambulatory Visit: Payer: Medicare Other

## 2021-07-14 ENCOUNTER — Telehealth: Payer: Self-pay

## 2021-07-14 ENCOUNTER — Emergency Department (HOSPITAL_COMMUNITY)
Admission: EM | Admit: 2021-07-14 | Discharge: 2021-07-14 | Disposition: A | Discharge status: Home / Self Care | Payer: Medicare Other | Attending: Emergency Medicine | Admitting: Emergency Medicine

## 2021-07-14 ENCOUNTER — Emergency Department (HOSPITAL_COMMUNITY): Payer: Medicare Other

## 2021-07-14 ENCOUNTER — Encounter (HOSPITAL_COMMUNITY): Payer: Self-pay

## 2021-07-14 ENCOUNTER — Inpatient Hospital Stay: Payer: Medicare Other

## 2021-07-14 ENCOUNTER — Other Ambulatory Visit: Payer: Self-pay

## 2021-07-14 DIAGNOSIS — I10 Essential (primary) hypertension: Secondary | ICD-10-CM | POA: Diagnosis not present

## 2021-07-14 DIAGNOSIS — M4802 Spinal stenosis, cervical region: Secondary | ICD-10-CM | POA: Diagnosis not present

## 2021-07-14 DIAGNOSIS — S8001XA Contusion of right knee, initial encounter: Secondary | ICD-10-CM | POA: Diagnosis not present

## 2021-07-14 DIAGNOSIS — W19XXXA Unspecified fall, initial encounter: Secondary | ICD-10-CM

## 2021-07-14 DIAGNOSIS — M25552 Pain in left hip: Secondary | ICD-10-CM | POA: Diagnosis not present

## 2021-07-14 DIAGNOSIS — Z79899 Other long term (current) drug therapy: Secondary | ICD-10-CM | POA: Diagnosis not present

## 2021-07-14 DIAGNOSIS — G8929 Other chronic pain: Secondary | ICD-10-CM | POA: Diagnosis not present

## 2021-07-14 DIAGNOSIS — R310 Gross hematuria: Secondary | ICD-10-CM | POA: Diagnosis not present

## 2021-07-14 DIAGNOSIS — M47812 Spondylosis without myelopathy or radiculopathy, cervical region: Secondary | ICD-10-CM | POA: Diagnosis not present

## 2021-07-14 DIAGNOSIS — S0990XA Unspecified injury of head, initial encounter: Secondary | ICD-10-CM | POA: Diagnosis not present

## 2021-07-14 DIAGNOSIS — S40011A Contusion of right shoulder, initial encounter: Secondary | ICD-10-CM | POA: Insufficient documentation

## 2021-07-14 DIAGNOSIS — W010XXA Fall on same level from slipping, tripping and stumbling without subsequent striking against object, initial encounter: Secondary | ICD-10-CM | POA: Diagnosis not present

## 2021-07-14 DIAGNOSIS — Z191 Hormone sensitive malignancy status: Secondary | ICD-10-CM | POA: Diagnosis not present

## 2021-07-14 DIAGNOSIS — D649 Anemia, unspecified: Secondary | ICD-10-CM | POA: Insufficient documentation

## 2021-07-14 DIAGNOSIS — S7002XA Contusion of left hip, initial encounter: Secondary | ICD-10-CM | POA: Insufficient documentation

## 2021-07-14 DIAGNOSIS — Z7984 Long term (current) use of oral hypoglycemic drugs: Secondary | ICD-10-CM | POA: Diagnosis not present

## 2021-07-14 DIAGNOSIS — I6523 Occlusion and stenosis of bilateral carotid arteries: Secondary | ICD-10-CM | POA: Diagnosis not present

## 2021-07-14 DIAGNOSIS — G4489 Other headache syndrome: Secondary | ICD-10-CM | POA: Diagnosis not present

## 2021-07-14 DIAGNOSIS — E039 Hypothyroidism, unspecified: Secondary | ICD-10-CM | POA: Insufficient documentation

## 2021-07-14 DIAGNOSIS — M25561 Pain in right knee: Secondary | ICD-10-CM | POA: Diagnosis not present

## 2021-07-14 DIAGNOSIS — Y92009 Unspecified place in unspecified non-institutional (private) residence as the place of occurrence of the external cause: Secondary | ICD-10-CM | POA: Diagnosis not present

## 2021-07-14 DIAGNOSIS — Z743 Need for continuous supervision: Secondary | ICD-10-CM | POA: Diagnosis not present

## 2021-07-14 DIAGNOSIS — S1093XA Contusion of unspecified part of neck, initial encounter: Secondary | ICD-10-CM | POA: Insufficient documentation

## 2021-07-14 DIAGNOSIS — S4981XA Other specified injuries of right shoulder and upper arm, initial encounter: Secondary | ICD-10-CM | POA: Diagnosis not present

## 2021-07-14 DIAGNOSIS — S0093XA Contusion of unspecified part of head, initial encounter: Secondary | ICD-10-CM | POA: Insufficient documentation

## 2021-07-14 DIAGNOSIS — T07XXXA Unspecified multiple injuries, initial encounter: Secondary | ICD-10-CM

## 2021-07-14 DIAGNOSIS — E119 Type 2 diabetes mellitus without complications: Secondary | ICD-10-CM | POA: Diagnosis not present

## 2021-07-14 DIAGNOSIS — S199XXA Unspecified injury of neck, initial encounter: Secondary | ICD-10-CM | POA: Diagnosis not present

## 2021-07-14 DIAGNOSIS — Z043 Encounter for examination and observation following other accident: Secondary | ICD-10-CM | POA: Diagnosis not present

## 2021-07-14 DIAGNOSIS — M542 Cervicalgia: Secondary | ICD-10-CM | POA: Diagnosis not present

## 2021-07-14 DIAGNOSIS — Z510 Encounter for antineoplastic radiation therapy: Secondary | ICD-10-CM | POA: Diagnosis not present

## 2021-07-14 LAB — CBC
HCT: 33.5 % — ABNORMAL LOW (ref 39.0–52.0)
Hemoglobin: 11.1 g/dL — ABNORMAL LOW (ref 13.0–17.0)
MCH: 32.1 pg (ref 26.0–34.0)
MCHC: 33.1 g/dL (ref 30.0–36.0)
MCV: 96.8 fL (ref 80.0–100.0)
Platelets: 164 10*3/uL (ref 150–400)
RBC: 3.46 MIL/uL — ABNORMAL LOW (ref 4.22–5.81)
RDW: 13.1 % (ref 11.5–15.5)
WBC: 5.3 10*3/uL (ref 4.0–10.5)
nRBC: 0 % (ref 0.0–0.2)

## 2021-07-14 LAB — URINALYSIS, ROUTINE W REFLEX MICROSCOPIC
Bilirubin Urine: NEGATIVE
Glucose, UA: NEGATIVE mg/dL
Hgb urine dipstick: NEGATIVE
Ketones, ur: NEGATIVE mg/dL
Leukocytes,Ua: NEGATIVE
Nitrite: NEGATIVE
Protein, ur: NEGATIVE mg/dL
Specific Gravity, Urine: 1.011 (ref 1.005–1.030)
pH: 7 (ref 5.0–8.0)

## 2021-07-14 LAB — COMPREHENSIVE METABOLIC PANEL
ALT: 10 U/L (ref 0–44)
AST: 13 U/L — ABNORMAL LOW (ref 15–41)
Albumin: 3.3 g/dL — ABNORMAL LOW (ref 3.5–5.0)
Alkaline Phosphatase: 43 U/L (ref 38–126)
Anion gap: 4 — ABNORMAL LOW (ref 5–15)
BUN: 14 mg/dL (ref 8–23)
CO2: 29 mmol/L (ref 22–32)
Calcium: 9.1 mg/dL (ref 8.9–10.3)
Chloride: 104 mmol/L (ref 98–111)
Creatinine, Ser: 0.99 mg/dL (ref 0.61–1.24)
GFR, Estimated: >60 mL/min (ref >60)
Glucose, Bld: 109 mg/dL — ABNORMAL HIGH (ref 70–99)
Potassium: 4.1 mmol/L (ref 3.5–5.1)
Sodium: 137 mmol/L (ref 135–145)
Total Bilirubin: 0.7 mg/dL (ref 0.3–1.2)
Total Protein: 6.5 g/dL (ref 6.5–8.1)

## 2021-07-14 MED ORDER — HYDROCODONE-ACETAMINOPHEN 5-325 MG PO TABS
1.0000 | ORAL_TABLET | Freq: Once | ORAL | Status: AC
  Administered 2021-07-14: 1 via ORAL
  Filled 2021-07-14: qty 1

## 2021-07-14 NOTE — Discharge Instructions (Addendum)
Please use walker when walking as discussed. Return for reevaluation if you are having worsening symptoms

## 2021-07-14 NOTE — ED Triage Notes (Signed)
Pt bib ems from home s/p fall pt states he's been losing his balance for the past few weeks and has fallen twice in the last week. Pt called ems when he couldn't get up this morning. Pt reports neck and head pain. Large bruise on right shoulder from previous fall.

## 2021-07-14 NOTE — Telephone Encounter (Signed)
RN called to check in with patient to see why he miss several scheduled radiation appointments.  Spoke with wife and she reports that Cameron Moran had a fall on Sunday 07/10/2021 in the home that's why they cancelled Monday 07/11/2021.  He had headache/neck pain/bruising to collarbone area and refused to go to emergency room.  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  Advised her to call EMS to get Cameron Moran check out but he doesn't want to go.  She is cancelling today's appointment 07/14/2021 with radiation.  Again urging her to call EMS to get him check out she said she will and will update Dr. Johny Shears office later today.

## 2021-07-14 NOTE — ED Provider Notes (Signed)
[facility] DEPT Provider Note   CSN: [id number] Arrival date & time: 07/14/21  1497     History  Chief Complaint  Patient presents with   Lytle Michaels    Cameron Moran is a 71 y.o. male.  HPI 71 yo male presents today with fall- patient tripped and lost balance trying to let dog out.  He fell backwards and struck head.  NO loc, no blood thinners.  Patient fell on Sunday and fire department came out to get him up,  but he was not seen. Today- head injury, neck pain, left hip From Sunday, right shoulder pain, neck to left Chronic knee pain with worsened right pain since Sunday. Some head pain now. No chest pain or dyspnea No abdominal pain Walk with cane or walker     Home Medications Prior to Admission medications   Medication Sig Start Date End Date Taking? Authorizing Provider  albuterol (VENTOLIN HFA) 108 (90 Base) MCG/ACT inhaler Inhale 2 puffs into the lungs every 6 (six) hours as needed for wheezing or shortness of breath.    [provider]  allopurinol (ZYLOPRIM) 300 MG tablet Take 300 mg by mouth daily as needed (gout).    [provider]  ALPRAZolam Duanne Moron) 0.25 MG tablet Take 0.25 mg by mouth 2 (two) times daily as needed for anxiety.    [provider]  atorvastatin (LIPITOR) 40 MG tablet Take 1 tablet (40 mg total) by mouth daily. 08/15/18 05/24/21  Cherylann Ratel A, DO  Blood Glucose Monitoring Suppl (CONTOUR NEXT EZ) w/Device KIT See admin instructions.    [provider]  buPROPion (WELLBUTRIN XL) 300 MG 24 hr tablet 300 mg daily.    [provider]  Calcium Carb-Cholecalciferol (CALCIUM 600 + D PO) Take 1 tablet by mouth 2 (two) times daily.    [provider]  ciprofloxacin (CIPRO) 500 MG tablet Take 1 tablet (500 mg total) by mouth 2 (two) times daily for 7 days. 07/09/21 07/16/21  Tyler Pita, MD  citalopram (CELEXA) 20 MG tablet Take 20 mg by mouth daily.    [provider]  HYDROcodone-acetaminophen (NORCO/VICODIN) 5-325 MG tablet Take 2 tablets by mouth 2 (two) times daily.    [provider]  iron polysaccharides (NIFEREX) 150 MG capsule Take 300 mg by mouth daily. 10/19/20   [provider]  Levothyroxine Sodium 112 MCG CAPS Take 224 mcg by mouth daily with breakfast.     [provider]  lisinopril (PRINIVIL,ZESTRIL) 10 MG tablet Take 10 mg by mouth daily.    [provider]  loperamide (IMODIUM A-D) 2 MG tablet Take 1 tablet (2 mg total) by mouth 4 (four) times daily as needed for diarrhea or loose stools. Patient not taking: Reported on 05/04/2021 03/11/21   Antonieta Pert, MD  Melatonin 10 MG CAPS Take 20 mg by mouth at bedtime.    [provider]  metFORMIN (GLUCOPHAGE) 1000 MG tablet Take 1,000 mg by mouth daily.    [provider]  Naphazoline HCl (CLEAR EYES OP) Place 1 drop into both eyes daily as needed (allergies).    [provider]  NUBEQA 300 MG tablet Take 600 mg by mouth 2 (two) times daily. 04/18/21   [provider]  polyethylene glycol (MIRALAX / GLYCOLAX) 17 g packet Take 17 g by mouth daily as needed for moderate constipation.    [provider]  Semaglutide, 1 MG/DOSE, (OZEMPIC, 1 MG/DOSE,) 2 MG/1.5ML SOPN Inject 1 mg into  the muscle every Monday.    [provider]  tamsulosin (FLOMAX) 0.4 MG CAPS capsule Take 0.8 mg by mouth at bedtime.    [provider]  triamcinolone cream (KENALOG) 0.1 % Apply 1 application topically 2 (two) times daily as needed (itching).    [provider]      Allergies    Doxycycline calcium and Penicillin v potassium    Review of Systems   Review of Systems  Physical Exam Updated Vital Signs BP 121/80   Pulse 71   Temp 98.1 F (36.7 C) (Oral)   Resp (!) 21   Ht 1.753 m ($Remove'5\' 9"'kFbkpnA$ )   Wt (!) 158.8 kg   SpO2 96%   BMI 51.69 kg/m  Physical Exam Vitals and nursing note reviewed.  Constitutional:       Appearance: Normal appearance. He is obese.  HENT:     Head:     Comments: Ttp to occiput    Right Ear: External ear normal.     Left Ear: External ear normal.     Nose: Nose normal.     Mouth/Throat:     Mouth: Mucous membranes are moist.     Pharynx: Oropharynx is clear.  Eyes:     Extraocular Movements: Extraocular movements intact.     Pupils: Pupils are equal, round, and reactive to light.  Cardiovascular:     Rate and Rhythm: Normal rate and regular rhythm.     Pulses: Normal pulses.  Pulmonary:     Effort: Pulmonary effort is normal.     Breath sounds: Normal breath sounds.  Abdominal:     General: Abdomen is flat. Bowel sounds are normal.  Musculoskeletal:     Cervical back: Normal range of motion.     Comments: Contusion left hip Contusion right shoulder   Skin:    Capillary Refill: Capillary refill takes less than 2 seconds.     Comments: Multiple contusions  Neurological:     General: No focal deficit present.     Mental Status: He is alert.  Psychiatric:        Mood and Affect: Mood normal.    ED Results / Procedures / Treatments   Labs (all labs ordered are listed, but only abnormal results are displayed) Labs Reviewed  CBC - Abnormal; Notable for the following components:      Result Value   RBC 3.46 (*)    Hemoglobin 11.1 (*)    HCT 33.5 (*)    All other components within normal limits  COMPREHENSIVE METABOLIC PANEL - Abnormal; Notable for the following components:   Glucose, Bld 109 (*)    Albumin 3.3 (*)    AST 13 (*)    Anion gap 4 (*)    All other components within normal limits  URINALYSIS, ROUTINE W REFLEX MICROSCOPIC    EKG None  Radiology DG Chest 1 View  Result Date: 07/14/2021 CLINICAL DATA:  Status post fall. EXAM: CHEST  1 VIEW COMPARISON:  March 09, 2021. FINDINGS: Stable cardiomegaly. Both lungs are clear. The visualized skeletal structures are unremarkable. IMPRESSION: No active disease. Electronically Signed   By: Marijo Conception M.D.   On: 07/14/2021 10:22   CT Head Wo Contrast  Result Date: 07/14/2021 CLINICAL DATA:  Trauma, fall EXAM: CT HEAD WITHOUT CONTRAST TECHNIQUE: Contiguous axial images were obtained from the base of the skull through the vertex without intravenous contrast. RADIATION DOSE REDUCTION: This exam was performed according to the departmental dose-optimization program  which includes automated exposure control, adjustment of the mA and/or kV according to patient size and/or use of iterative reconstruction technique. COMPARISON:  03/09/2021 FINDINGS: Brain: No acute intracranial findings are seen. There are no signs of bleeding within the cranium. Cortical sulci are prominent. Ventricles are not dilated. There is no focal mass effect. Vascular: Scattered arterial calcifications are seen. Skull: Unremarkable. Sinuses/Orbits: Unremarkable. Other: None. IMPRESSION: No acute intracranial findings are seen in noncontrast CT brain. Atrophy. Electronically Signed   By: Elmer Picker M.D.   On: 07/14/2021 10:42   CT Cervical Spine Wo Contrast  Result Date: 07/14/2021 CLINICAL DATA:  Trauma, fall EXAM: CT CERVICAL SPINE WITHOUT CONTRAST TECHNIQUE: Multidetector CT imaging of the cervical spine was performed without intravenous contrast. Multiplanar CT image reconstructions were also generated. RADIATION DOSE REDUCTION: This exam was performed according to the departmental dose-optimization program which includes automated exposure control, adjustment of the mA and/or kV according to patient size and/or use of iterative reconstruction technique. COMPARISON:  None Available. FINDINGS: Alignment: Alignment of posterior margins of vertebral bodies is unremarkable. Skull base and vertebrae: No recent fracture is seen. There is linear smooth marginated calcification posterior to the spinous processes of C5 and C6 vertebrae suggesting ligament injury from previous trauma. Soft tissues and spinal canal: Posterior  bony spurs are causing extrinsic pressure over the ventral margin of thecal sac. There is mild spinal stenosis at C5-C6 and C6-C7 levels. Disc levels: There is encroachment of neural foramina from C2-T1 levels. Upper chest: Upper lung fields are not included in the images. Other: Thyroid is not distinctly visualized. There are coarse calcifications in the carotid arteries, more so at right common carotid bifurcation. IMPRESSION: No recent fracture is seen in the cervical spine. Cervical spondylosis with encroachment of neural foramina from C2-T1 levels. Electronically Signed   By: Elmer Picker M.D.   On: 07/14/2021 10:47   DG Knee Complete 4 Views Right  Result Date: 07/14/2021 CLINICAL DATA:  Fall.  Pain. EXAM: RIGHT KNEE - COMPLETE 4+ VIEW COMPARISON:  None Available. FINDINGS: There is diffuse decreased bone mineralization. Severe medial compartment joint space narrowing bone-on-bone contact and mild peripheral degenerative osteophytes. Mild lateral compartment joint space narrowing and moderate peripheral osteophytosis. Lateral compartment chondrocalcinosis. Moderate to severe patellofemoral joint space narrowing and peripheral osteophytosis. No definite joint effusion. No acute fracture or dislocation. 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 Electronically Signed   By: Yvonne Kendall M.D.   On: 07/14/2021 10:23   DG Hip Unilat W or Wo Pelvis 2-3 Views Left  Result Date: 07/14/2021 CLINICAL DATA:  Provided history: Pain, fall. EXAM: DG HIP (WITH OR WITHOUT PELVIS) 2-3V LEFT COMPARISON:  No pertinent prior exams available for comparison. FINDINGS: There is normal bony alignment. No evidence of acute osseous or articular abnormality. Mild bilateral femoroacetabular joint osteoarthrosis. IMPRESSION: No evidence of acute osseous or articular abnormality. Electronically Signed   By: Kellie Simmering D.O.   On: 07/14/2021 10:20    Procedures Procedures     Medications Ordered in ED Medications  HYDROcodone-acetaminophen (NORCO/VICODIN) 5-325 MG per tablet 1 tablet (1 tablet Oral Given 07/14/21 0924)    ED Course/ Medical Decision Making/ A&P Clinical Course as of 07/14/21 1106  Thu Jul 14, 2021  4854 Complete metabolic panel reviewed and interpreted and significant for no acute changes although the glucose is 109 and he has decreased albumin [DR]  1043 Urinalysis, Routine w reflex microscopic Urine, Clean Catch Urinalysis shows no evidence of leukocytes  or hemoglobin [DR]  1043 CBC(!) [DR]  1044 CBC reviewed interpreted and significant for anemia which is stable [DR]  1059 CT head reviewed interpreted without any evidence of acute abnormalities and radiologist interpretation concurs [DR]  1100 CT of cervical spine reveals no evidence of fracture per radiologist interpretation [DR]  1100 Right knee reviewed and interpreted with severe osteoarthritis with no evidence of acute fractures [DR]  1100 Chest x-Lendell Gallick reviewed and interpreted with no evidence of acute abnormality and radiologist interpretation concurs [DR]  1101 Left hip x-Jayen Bromwell reviewed and interpreted without any evidence of acute abnormality and radiologist interpretation concurs [DR]    Clinical Course User Index [DR] Pattricia Boss, MD                           Medical Decision Making Fall Appears mechanical X-rays and labs ordered Hydrocodone- patient takes at home- ordered Patient evaluated here in the ED with labs and does not show any evidence of acute abnormalities which would cause falls. Labs and x-rays ordered to evaluate for injury from fall and does not show any evidence of acute intracranial abnormality or bony fractures   Amount and/or Complexity of Data Reviewed Labs: ordered. Decision-making details documented in ED Course. Radiology: ordered and independent interpretation performed. Decision-making details documented in ED Course.  Risk Prescription drug  management.   Discussed safe ambulation and use of walker with patient We discussed return precautions and need for follow-up and voiced understanding        Final Clinical Impression(s) / ED Diagnoses Final diagnoses:  Fall, initial encounter  Injury of head, initial encounter  Multiple contusions    Rx / DC Orders ED Discharge Orders     None         Pattricia Boss, MD 07/14/21 1106

## 2021-07-15 ENCOUNTER — Inpatient Hospital Stay: Payer: Medicare Other

## 2021-07-15 ENCOUNTER — Ambulatory Visit: Payer: Medicare Other

## 2021-07-15 DIAGNOSIS — E039 Hypothyroidism, unspecified: Secondary | ICD-10-CM | POA: Diagnosis not present

## 2021-07-15 DIAGNOSIS — E1165 Type 2 diabetes mellitus with hyperglycemia: Secondary | ICD-10-CM | POA: Diagnosis not present

## 2021-07-15 DIAGNOSIS — I10 Essential (primary) hypertension: Secondary | ICD-10-CM | POA: Diagnosis not present

## 2021-07-15 DIAGNOSIS — E782 Mixed hyperlipidemia: Secondary | ICD-10-CM | POA: Diagnosis not present

## 2021-07-19 ENCOUNTER — Other Ambulatory Visit: Payer: Self-pay

## 2021-07-19 ENCOUNTER — Inpatient Hospital Stay: Payer: Medicare Other

## 2021-07-19 ENCOUNTER — Ambulatory Visit
Admission: RE | Admit: 2021-07-19 | Discharge: 2021-07-19 | Disposition: A | Discharge status: Home / Self Care | Payer: Medicare Other | Source: Ambulatory Visit | Attending: Radiation Oncology | Admitting: Radiation Oncology

## 2021-07-19 DIAGNOSIS — Z79899 Other long term (current) drug therapy: Secondary | ICD-10-CM | POA: Diagnosis not present

## 2021-07-19 DIAGNOSIS — R310 Gross hematuria: Secondary | ICD-10-CM | POA: Diagnosis not present

## 2021-07-19 DIAGNOSIS — Z510 Encounter for antineoplastic radiation therapy: Secondary | ICD-10-CM | POA: Diagnosis not present

## 2021-07-19 DIAGNOSIS — Z191 Hormone sensitive malignancy status: Secondary | ICD-10-CM | POA: Diagnosis not present

## 2021-07-19 LAB — RAD ONC ARIA SESSION SUMMARY
Course Elapsed Days: 29
Plan Fractions Treated to Date: 15
Plan Prescribed Dose Per Fraction: 1.8 Gy
Plan Total Fractions Prescribed: 25
Plan Total Prescribed Dose: 45 Gy
Reference Point Dosage Given to Date: 27 Gy
Reference Point Session Dosage Given: 1.8 Gy
Session Number: 15

## 2021-07-20 ENCOUNTER — Inpatient Hospital Stay: Payer: Medicare Other

## 2021-07-20 ENCOUNTER — Ambulatory Visit: Admission: RE | Admit: 2021-07-20 | Payer: Medicare Other | Source: Ambulatory Visit

## 2021-07-20 ENCOUNTER — Other Ambulatory Visit: Payer: Self-pay

## 2021-07-20 ENCOUNTER — Ambulatory Visit
Admission: RE | Admit: 2021-07-20 | Discharge: 2021-07-20 | Disposition: A | Discharge status: Home / Self Care | Payer: Medicare Other | Source: Ambulatory Visit | Attending: Radiation Oncology | Admitting: Radiation Oncology

## 2021-07-20 DIAGNOSIS — R310 Gross hematuria: Secondary | ICD-10-CM | POA: Diagnosis not present

## 2021-07-20 DIAGNOSIS — Z79899 Other long term (current) drug therapy: Secondary | ICD-10-CM | POA: Diagnosis not present

## 2021-07-20 DIAGNOSIS — Z510 Encounter for antineoplastic radiation therapy: Secondary | ICD-10-CM | POA: Diagnosis not present

## 2021-07-20 DIAGNOSIS — Z191 Hormone sensitive malignancy status: Secondary | ICD-10-CM | POA: Diagnosis not present

## 2021-07-20 LAB — RAD ONC ARIA SESSION SUMMARY
Course Elapsed Days: 30
Plan Fractions Treated to Date: 16
Plan Prescribed Dose Per Fraction: 1.8 Gy
Plan Total Fractions Prescribed: 25
Plan Total Prescribed Dose: 45 Gy
Reference Point Dosage Given to Date: 28.8 Gy
Reference Point Session Dosage Given: 1.8 Gy
Session Number: 16

## 2021-07-21 ENCOUNTER — Other Ambulatory Visit: Payer: Self-pay

## 2021-07-21 ENCOUNTER — Ambulatory Visit: Admission: RE | Admit: 2021-07-21 | Payer: Medicare Other | Source: Ambulatory Visit

## 2021-07-21 ENCOUNTER — Ambulatory Visit
Admission: RE | Admit: 2021-07-21 | Discharge: 2021-07-21 | Disposition: A | Discharge status: Home / Self Care | Payer: Medicare Other | Source: Ambulatory Visit | Attending: Radiation Oncology | Admitting: Radiation Oncology

## 2021-07-21 ENCOUNTER — Inpatient Hospital Stay: Payer: Medicare Other | Attending: Radiation Oncology

## 2021-07-21 DIAGNOSIS — C61 Malignant neoplasm of prostate: Secondary | ICD-10-CM | POA: Diagnosis present

## 2021-07-21 DIAGNOSIS — Z191 Hormone sensitive malignancy status: Secondary | ICD-10-CM | POA: Diagnosis not present

## 2021-07-21 DIAGNOSIS — Z510 Encounter for antineoplastic radiation therapy: Secondary | ICD-10-CM | POA: Diagnosis not present

## 2021-07-21 LAB — RAD ONC ARIA SESSION SUMMARY
Course Elapsed Days: 31
Plan Fractions Treated to Date: 17
Plan Prescribed Dose Per Fraction: 1.8 Gy
Plan Total Fractions Prescribed: 25
Plan Total Prescribed Dose: 45 Gy
Reference Point Dosage Given to Date: 30.6 Gy
Reference Point Session Dosage Given: 1.8 Gy
Session Number: 17

## 2021-07-22 ENCOUNTER — Other Ambulatory Visit: Payer: Self-pay

## 2021-07-22 ENCOUNTER — Ambulatory Visit
Admission: RE | Admit: 2021-07-22 | Discharge: 2021-07-22 | Disposition: A | Discharge status: Home / Self Care | Payer: Medicare Other | Source: Ambulatory Visit | Attending: Radiation Oncology | Admitting: Radiation Oncology

## 2021-07-22 ENCOUNTER — Inpatient Hospital Stay: Payer: Medicare Other

## 2021-07-22 DIAGNOSIS — Z510 Encounter for antineoplastic radiation therapy: Secondary | ICD-10-CM | POA: Diagnosis not present

## 2021-07-22 DIAGNOSIS — C61 Malignant neoplasm of prostate: Secondary | ICD-10-CM | POA: Diagnosis not present

## 2021-07-22 DIAGNOSIS — Z191 Hormone sensitive malignancy status: Secondary | ICD-10-CM | POA: Diagnosis not present

## 2021-07-22 LAB — RAD ONC ARIA SESSION SUMMARY
Course Elapsed Days: 32
Plan Fractions Treated to Date: 18
Plan Prescribed Dose Per Fraction: 1.8 Gy
Plan Total Fractions Prescribed: 25
Plan Total Prescribed Dose: 45 Gy
Reference Point Dosage Given to Date: 32.4 Gy
Reference Point Session Dosage Given: 1.8 Gy
Session Number: 18

## 2021-07-25 ENCOUNTER — Other Ambulatory Visit: Payer: Self-pay

## 2021-07-25 ENCOUNTER — Ambulatory Visit: Payer: Medicare Other

## 2021-07-25 ENCOUNTER — Inpatient Hospital Stay: Payer: Medicare Other

## 2021-07-25 ENCOUNTER — Ambulatory Visit
Admission: RE | Admit: 2021-07-25 | Discharge: 2021-07-25 | Disposition: A | Discharge status: Home / Self Care | Payer: Medicare Other | Source: Ambulatory Visit | Attending: Radiation Oncology | Admitting: Radiation Oncology

## 2021-07-25 DIAGNOSIS — Z510 Encounter for antineoplastic radiation therapy: Secondary | ICD-10-CM | POA: Diagnosis not present

## 2021-07-25 DIAGNOSIS — C61 Malignant neoplasm of prostate: Secondary | ICD-10-CM | POA: Diagnosis not present

## 2021-07-25 DIAGNOSIS — Z191 Hormone sensitive malignancy status: Secondary | ICD-10-CM | POA: Diagnosis not present

## 2021-07-25 LAB — RAD ONC ARIA SESSION SUMMARY
Course Elapsed Days: 35
Plan Fractions Treated to Date: 19
Plan Prescribed Dose Per Fraction: 1.8 Gy
Plan Total Fractions Prescribed: 25
Plan Total Prescribed Dose: 45 Gy
Reference Point Dosage Given to Date: 34.2 Gy
Reference Point Session Dosage Given: 1.8 Gy
Session Number: 19

## 2021-07-26 ENCOUNTER — Ambulatory Visit: Payer: Medicare Other

## 2021-07-26 ENCOUNTER — Inpatient Hospital Stay: Payer: Medicare Other

## 2021-07-26 ENCOUNTER — Ambulatory Visit
Admission: RE | Admit: 2021-07-26 | Discharge: 2021-07-26 | Disposition: A | Discharge status: Home / Self Care | Payer: Medicare Other | Source: Ambulatory Visit | Attending: Radiation Oncology | Admitting: Radiation Oncology

## 2021-07-26 ENCOUNTER — Other Ambulatory Visit: Payer: Self-pay

## 2021-07-26 DIAGNOSIS — Z191 Hormone sensitive malignancy status: Secondary | ICD-10-CM | POA: Diagnosis not present

## 2021-07-26 DIAGNOSIS — Z510 Encounter for antineoplastic radiation therapy: Secondary | ICD-10-CM | POA: Diagnosis not present

## 2021-07-26 DIAGNOSIS — C61 Malignant neoplasm of prostate: Secondary | ICD-10-CM | POA: Diagnosis not present

## 2021-07-26 LAB — RAD ONC ARIA SESSION SUMMARY
Course Elapsed Days: 36
Plan Fractions Treated to Date: 20
Plan Prescribed Dose Per Fraction: 1.8 Gy
Plan Total Fractions Prescribed: 25
Plan Total Prescribed Dose: 45 Gy
Reference Point Dosage Given to Date: 36 Gy
Reference Point Session Dosage Given: 1.8 Gy
Session Number: 20

## 2021-07-27 ENCOUNTER — Other Ambulatory Visit: Payer: Self-pay

## 2021-07-27 ENCOUNTER — Ambulatory Visit: Payer: Medicare Other

## 2021-07-27 ENCOUNTER — Ambulatory Visit
Admission: RE | Admit: 2021-07-27 | Discharge: 2021-07-27 | Disposition: A | Discharge status: Home / Self Care | Payer: Medicare Other | Source: Ambulatory Visit | Attending: Radiation Oncology | Admitting: Radiation Oncology

## 2021-07-27 ENCOUNTER — Inpatient Hospital Stay: Payer: Medicare Other

## 2021-07-27 DIAGNOSIS — Z510 Encounter for antineoplastic radiation therapy: Secondary | ICD-10-CM | POA: Diagnosis not present

## 2021-07-27 DIAGNOSIS — Z191 Hormone sensitive malignancy status: Secondary | ICD-10-CM | POA: Diagnosis not present

## 2021-07-27 DIAGNOSIS — C61 Malignant neoplasm of prostate: Secondary | ICD-10-CM | POA: Diagnosis not present

## 2021-07-27 LAB — RAD ONC ARIA SESSION SUMMARY
Course Elapsed Days: 37
Plan Fractions Treated to Date: 21
Plan Prescribed Dose Per Fraction: 1.8 Gy
Plan Total Fractions Prescribed: 25
Plan Total Prescribed Dose: 45 Gy
Reference Point Dosage Given to Date: 37.8 Gy
Reference Point Session Dosage Given: 1.8 Gy
Session Number: 21

## 2021-07-28 ENCOUNTER — Other Ambulatory Visit: Payer: Self-pay

## 2021-07-28 ENCOUNTER — Inpatient Hospital Stay: Payer: Medicare Other

## 2021-07-28 ENCOUNTER — Ambulatory Visit: Payer: Medicare Other

## 2021-07-28 ENCOUNTER — Ambulatory Visit
Admission: RE | Admit: 2021-07-28 | Discharge: 2021-07-28 | Disposition: A | Discharge status: Home / Self Care | Payer: Medicare Other | Source: Ambulatory Visit | Attending: Radiation Oncology | Admitting: Radiation Oncology

## 2021-07-28 DIAGNOSIS — Z510 Encounter for antineoplastic radiation therapy: Secondary | ICD-10-CM | POA: Diagnosis not present

## 2021-07-28 DIAGNOSIS — Z191 Hormone sensitive malignancy status: Secondary | ICD-10-CM | POA: Diagnosis not present

## 2021-07-28 DIAGNOSIS — C61 Malignant neoplasm of prostate: Secondary | ICD-10-CM | POA: Diagnosis not present

## 2021-07-28 LAB — RAD ONC ARIA SESSION SUMMARY
Course Elapsed Days: 38
Plan Fractions Treated to Date: 22
Plan Prescribed Dose Per Fraction: 1.8 Gy
Plan Total Fractions Prescribed: 25
Plan Total Prescribed Dose: 45 Gy
Reference Point Dosage Given to Date: 39.6 Gy
Reference Point Session Dosage Given: 1.8 Gy
Session Number: 22

## 2021-07-29 ENCOUNTER — Other Ambulatory Visit: Payer: Self-pay

## 2021-07-29 ENCOUNTER — Ambulatory Visit
Admission: RE | Admit: 2021-07-29 | Discharge: 2021-07-29 | Disposition: A | Discharge status: Home / Self Care | Payer: Medicare Other | Source: Ambulatory Visit | Attending: Radiation Oncology | Admitting: Radiation Oncology

## 2021-07-29 ENCOUNTER — Ambulatory Visit: Payer: Medicare Other

## 2021-07-29 ENCOUNTER — Inpatient Hospital Stay: Payer: Medicare Other

## 2021-07-29 DIAGNOSIS — Z191 Hormone sensitive malignancy status: Secondary | ICD-10-CM | POA: Diagnosis not present

## 2021-07-29 DIAGNOSIS — C61 Malignant neoplasm of prostate: Secondary | ICD-10-CM | POA: Diagnosis not present

## 2021-07-29 DIAGNOSIS — Z510 Encounter for antineoplastic radiation therapy: Secondary | ICD-10-CM | POA: Diagnosis not present

## 2021-07-29 LAB — RAD ONC ARIA SESSION SUMMARY
Course Elapsed Days: 39
Plan Fractions Treated to Date: 23
Plan Prescribed Dose Per Fraction: 1.8 Gy
Plan Total Fractions Prescribed: 25
Plan Total Prescribed Dose: 45 Gy
Reference Point Dosage Given to Date: 41.4 Gy
Reference Point Session Dosage Given: 1.8 Gy
Session Number: 23

## 2021-08-01 ENCOUNTER — Ambulatory Visit: Payer: Medicare Other

## 2021-08-01 ENCOUNTER — Inpatient Hospital Stay: Payer: Medicare Other

## 2021-08-01 ENCOUNTER — Other Ambulatory Visit: Payer: Self-pay

## 2021-08-01 ENCOUNTER — Ambulatory Visit
Admission: RE | Admit: 2021-08-01 | Discharge: 2021-08-01 | Disposition: A | Discharge status: Home / Self Care | Payer: Medicare Other | Source: Ambulatory Visit | Attending: Radiation Oncology | Admitting: Radiation Oncology

## 2021-08-01 DIAGNOSIS — Z191 Hormone sensitive malignancy status: Secondary | ICD-10-CM | POA: Diagnosis not present

## 2021-08-01 DIAGNOSIS — Z510 Encounter for antineoplastic radiation therapy: Secondary | ICD-10-CM | POA: Diagnosis not present

## 2021-08-01 DIAGNOSIS — C61 Malignant neoplasm of prostate: Secondary | ICD-10-CM | POA: Diagnosis not present

## 2021-08-01 LAB — RAD ONC ARIA SESSION SUMMARY
Course Elapsed Days: 42
Plan Fractions Treated to Date: 24
Plan Prescribed Dose Per Fraction: 1.8 Gy
Plan Total Fractions Prescribed: 25
Plan Total Prescribed Dose: 45 Gy
Reference Point Dosage Given to Date: 43.2 Gy
Reference Point Session Dosage Given: 1.8 Gy
Session Number: 24

## 2021-08-02 ENCOUNTER — Inpatient Hospital Stay: Payer: Medicare Other

## 2021-08-02 ENCOUNTER — Ambulatory Visit
Admission: RE | Admit: 2021-08-02 | Discharge: 2021-08-02 | Disposition: A | Discharge status: Home / Self Care | Payer: Medicare Other | Source: Ambulatory Visit | Attending: Radiation Oncology | Admitting: Radiation Oncology

## 2021-08-02 ENCOUNTER — Ambulatory Visit: Payer: Medicare Other

## 2021-08-02 ENCOUNTER — Other Ambulatory Visit: Payer: Self-pay

## 2021-08-02 DIAGNOSIS — Z191 Hormone sensitive malignancy status: Secondary | ICD-10-CM | POA: Diagnosis not present

## 2021-08-02 DIAGNOSIS — Z510 Encounter for antineoplastic radiation therapy: Secondary | ICD-10-CM | POA: Diagnosis not present

## 2021-08-02 DIAGNOSIS — C61 Malignant neoplasm of prostate: Secondary | ICD-10-CM | POA: Diagnosis not present

## 2021-08-02 LAB — RAD ONC ARIA SESSION SUMMARY
Course Elapsed Days: 43
Plan Fractions Treated to Date: 25
Plan Prescribed Dose Per Fraction: 1.8 Gy
Plan Total Fractions Prescribed: 25
Plan Total Prescribed Dose: 45 Gy
Reference Point Dosage Given to Date: 45 Gy
Reference Point Session Dosage Given: 1.8 Gy
Session Number: 25

## 2021-08-03 ENCOUNTER — Other Ambulatory Visit: Payer: Self-pay

## 2021-08-03 ENCOUNTER — Ambulatory Visit: Payer: Medicare Other

## 2021-08-03 ENCOUNTER — Inpatient Hospital Stay: Payer: Medicare Other

## 2021-08-03 ENCOUNTER — Ambulatory Visit
Admission: RE | Admit: 2021-08-03 | Discharge: 2021-08-03 | Disposition: A | Discharge status: Home / Self Care | Payer: Medicare Other | Source: Ambulatory Visit | Attending: Radiation Oncology | Admitting: Radiation Oncology

## 2021-08-03 DIAGNOSIS — Z191 Hormone sensitive malignancy status: Secondary | ICD-10-CM | POA: Diagnosis not present

## 2021-08-03 DIAGNOSIS — C61 Malignant neoplasm of prostate: Secondary | ICD-10-CM | POA: Diagnosis not present

## 2021-08-03 DIAGNOSIS — Z510 Encounter for antineoplastic radiation therapy: Secondary | ICD-10-CM | POA: Diagnosis not present

## 2021-08-03 LAB — RAD ONC ARIA SESSION SUMMARY
Course Elapsed Days: 44
Plan Fractions Treated to Date: 1
Plan Prescribed Dose Per Fraction: 2 Gy
Plan Total Fractions Prescribed: 15
Plan Total Prescribed Dose: 30 Gy
Reference Point Dosage Given to Date: 47 Gy
Reference Point Session Dosage Given: 2 Gy
Session Number: 26

## 2021-08-04 ENCOUNTER — Ambulatory Visit
Admission: RE | Admit: 2021-08-04 | Discharge: 2021-08-04 | Disposition: A | Discharge status: Home / Self Care | Payer: Medicare Other | Source: Ambulatory Visit | Attending: Radiation Oncology | Admitting: Radiation Oncology

## 2021-08-04 ENCOUNTER — Ambulatory Visit: Payer: Medicare Other

## 2021-08-04 ENCOUNTER — Other Ambulatory Visit: Payer: Self-pay

## 2021-08-04 ENCOUNTER — Inpatient Hospital Stay: Payer: Medicare Other

## 2021-08-04 DIAGNOSIS — C61 Malignant neoplasm of prostate: Secondary | ICD-10-CM | POA: Diagnosis not present

## 2021-08-04 DIAGNOSIS — Z191 Hormone sensitive malignancy status: Secondary | ICD-10-CM | POA: Diagnosis not present

## 2021-08-04 DIAGNOSIS — Z510 Encounter for antineoplastic radiation therapy: Secondary | ICD-10-CM | POA: Diagnosis not present

## 2021-08-04 LAB — RAD ONC ARIA SESSION SUMMARY
Course Elapsed Days: 45
Plan Fractions Treated to Date: 2
Plan Prescribed Dose Per Fraction: 2 Gy
Plan Total Fractions Prescribed: 15
Plan Total Prescribed Dose: 30 Gy
Reference Point Dosage Given to Date: 49 Gy
Reference Point Session Dosage Given: 2 Gy
Session Number: 27

## 2021-08-05 ENCOUNTER — Inpatient Hospital Stay: Payer: Medicare Other

## 2021-08-05 ENCOUNTER — Ambulatory Visit: Payer: Medicare Other

## 2021-08-05 ENCOUNTER — Ambulatory Visit
Admission: RE | Admit: 2021-08-05 | Discharge: 2021-08-05 | Disposition: A | Discharge status: Home / Self Care | Payer: Medicare Other | Source: Ambulatory Visit | Attending: Radiation Oncology | Admitting: Radiation Oncology

## 2021-08-05 ENCOUNTER — Other Ambulatory Visit: Payer: Self-pay

## 2021-08-05 DIAGNOSIS — Z191 Hormone sensitive malignancy status: Secondary | ICD-10-CM | POA: Diagnosis not present

## 2021-08-05 DIAGNOSIS — C61 Malignant neoplasm of prostate: Secondary | ICD-10-CM | POA: Diagnosis not present

## 2021-08-05 DIAGNOSIS — Z510 Encounter for antineoplastic radiation therapy: Secondary | ICD-10-CM | POA: Diagnosis not present

## 2021-08-05 LAB — RAD ONC ARIA SESSION SUMMARY
Course Elapsed Days: 46
Plan Fractions Treated to Date: 3
Plan Prescribed Dose Per Fraction: 2 Gy
Plan Total Fractions Prescribed: 15
Plan Total Prescribed Dose: 30 Gy
Reference Point Dosage Given to Date: 51 Gy
Reference Point Session Dosage Given: 2 Gy
Session Number: 28

## 2021-08-08 ENCOUNTER — Ambulatory Visit: Payer: Medicare Other

## 2021-08-08 ENCOUNTER — Inpatient Hospital Stay: Payer: Medicare Other

## 2021-08-08 ENCOUNTER — Other Ambulatory Visit: Payer: Self-pay

## 2021-08-08 ENCOUNTER — Ambulatory Visit
Admission: RE | Admit: 2021-08-08 | Discharge: 2021-08-08 | Disposition: A | Discharge status: Home / Self Care | Payer: Medicare Other | Source: Ambulatory Visit | Attending: Radiation Oncology | Admitting: Radiation Oncology

## 2021-08-08 DIAGNOSIS — Z191 Hormone sensitive malignancy status: Secondary | ICD-10-CM | POA: Diagnosis not present

## 2021-08-08 DIAGNOSIS — Z510 Encounter for antineoplastic radiation therapy: Secondary | ICD-10-CM | POA: Diagnosis not present

## 2021-08-08 DIAGNOSIS — C61 Malignant neoplasm of prostate: Secondary | ICD-10-CM | POA: Diagnosis not present

## 2021-08-08 LAB — RAD ONC ARIA SESSION SUMMARY
Course Elapsed Days: 49
Plan Fractions Treated to Date: 4
Plan Prescribed Dose Per Fraction: 2 Gy
Plan Total Fractions Prescribed: 15
Plan Total Prescribed Dose: 30 Gy
Reference Point Dosage Given to Date: 53 Gy
Reference Point Session Dosage Given: 2 Gy
Session Number: 29

## 2021-08-09 ENCOUNTER — Inpatient Hospital Stay: Payer: Medicare Other

## 2021-08-09 ENCOUNTER — Ambulatory Visit: Payer: Medicare Other

## 2021-08-10 ENCOUNTER — Ambulatory Visit
Admission: RE | Admit: 2021-08-10 | Discharge: 2021-08-10 | Disposition: A | Discharge status: Home / Self Care | Payer: Medicare Other | Source: Ambulatory Visit | Attending: Radiation Oncology | Admitting: Radiation Oncology

## 2021-08-10 ENCOUNTER — Other Ambulatory Visit: Payer: Self-pay

## 2021-08-10 ENCOUNTER — Inpatient Hospital Stay: Payer: Medicare Other

## 2021-08-10 ENCOUNTER — Ambulatory Visit: Payer: Medicare Other

## 2021-08-10 DIAGNOSIS — Z191 Hormone sensitive malignancy status: Secondary | ICD-10-CM | POA: Diagnosis not present

## 2021-08-10 DIAGNOSIS — C61 Malignant neoplasm of prostate: Secondary | ICD-10-CM | POA: Diagnosis not present

## 2021-08-10 DIAGNOSIS — Z510 Encounter for antineoplastic radiation therapy: Secondary | ICD-10-CM | POA: Diagnosis not present

## 2021-08-10 LAB — RAD ONC ARIA SESSION SUMMARY
Course Elapsed Days: 51
Plan Fractions Treated to Date: 5
Plan Prescribed Dose Per Fraction: 2 Gy
Plan Total Fractions Prescribed: 15
Plan Total Prescribed Dose: 30 Gy
Reference Point Dosage Given to Date: 55 Gy
Reference Point Session Dosage Given: 2 Gy
Session Number: 30

## 2021-08-11 ENCOUNTER — Inpatient Hospital Stay: Payer: Medicare Other

## 2021-08-11 ENCOUNTER — Ambulatory Visit
Admission: RE | Admit: 2021-08-11 | Discharge: 2021-08-11 | Disposition: A | Discharge status: Home / Self Care | Payer: Medicare Other | Source: Ambulatory Visit | Attending: Radiation Oncology | Admitting: Radiation Oncology

## 2021-08-11 ENCOUNTER — Other Ambulatory Visit: Payer: Self-pay

## 2021-08-11 DIAGNOSIS — C61 Malignant neoplasm of prostate: Secondary | ICD-10-CM | POA: Diagnosis not present

## 2021-08-11 DIAGNOSIS — Z510 Encounter for antineoplastic radiation therapy: Secondary | ICD-10-CM | POA: Diagnosis not present

## 2021-08-11 DIAGNOSIS — Z191 Hormone sensitive malignancy status: Secondary | ICD-10-CM | POA: Diagnosis not present

## 2021-08-11 LAB — RAD ONC ARIA SESSION SUMMARY
Course Elapsed Days: 52
Plan Fractions Treated to Date: 6
Plan Prescribed Dose Per Fraction: 2 Gy
Plan Total Fractions Prescribed: 15
Plan Total Prescribed Dose: 30 Gy
Reference Point Dosage Given to Date: 57 Gy
Reference Point Session Dosage Given: 2 Gy
Session Number: 31

## 2021-08-12 ENCOUNTER — Ambulatory Visit
Admission: RE | Admit: 2021-08-12 | Discharge: 2021-08-12 | Disposition: A | Discharge status: Home / Self Care | Payer: Medicare Other | Source: Ambulatory Visit | Attending: Radiation Oncology | Admitting: Radiation Oncology

## 2021-08-12 ENCOUNTER — Inpatient Hospital Stay: Payer: Medicare Other

## 2021-08-12 ENCOUNTER — Other Ambulatory Visit: Payer: Self-pay

## 2021-08-12 DIAGNOSIS — Z191 Hormone sensitive malignancy status: Secondary | ICD-10-CM | POA: Diagnosis not present

## 2021-08-12 DIAGNOSIS — C61 Malignant neoplasm of prostate: Secondary | ICD-10-CM | POA: Diagnosis not present

## 2021-08-12 DIAGNOSIS — Z510 Encounter for antineoplastic radiation therapy: Secondary | ICD-10-CM | POA: Diagnosis not present

## 2021-08-12 LAB — RAD ONC ARIA SESSION SUMMARY
Course Elapsed Days: 53
Plan Fractions Treated to Date: 7
Plan Prescribed Dose Per Fraction: 2 Gy
Plan Total Fractions Prescribed: 15
Plan Total Prescribed Dose: 30 Gy
Reference Point Dosage Given to Date: 59 Gy
Reference Point Session Dosage Given: 2 Gy
Session Number: 32

## 2021-08-13 ENCOUNTER — Ambulatory Visit: Payer: Medicare Other

## 2021-08-15 ENCOUNTER — Inpatient Hospital Stay: Payer: Medicare Other

## 2021-08-15 ENCOUNTER — Other Ambulatory Visit: Payer: Self-pay

## 2021-08-15 ENCOUNTER — Ambulatory Visit
Admission: RE | Admit: 2021-08-15 | Discharge: 2021-08-15 | Disposition: A | Discharge status: Home / Self Care | Payer: Medicare Other | Source: Ambulatory Visit | Attending: Radiation Oncology | Admitting: Radiation Oncology

## 2021-08-15 ENCOUNTER — Ambulatory Visit: Payer: Medicare Other

## 2021-08-15 DIAGNOSIS — C61 Malignant neoplasm of prostate: Secondary | ICD-10-CM | POA: Diagnosis not present

## 2021-08-15 DIAGNOSIS — Z191 Hormone sensitive malignancy status: Secondary | ICD-10-CM | POA: Diagnosis not present

## 2021-08-15 DIAGNOSIS — Z510 Encounter for antineoplastic radiation therapy: Secondary | ICD-10-CM | POA: Diagnosis not present

## 2021-08-15 LAB — RAD ONC ARIA SESSION SUMMARY
Course Elapsed Days: 56
Plan Fractions Treated to Date: 8
Plan Prescribed Dose Per Fraction: 2 Gy
Plan Total Fractions Prescribed: 15
Plan Total Prescribed Dose: 30 Gy
Reference Point Dosage Given to Date: 61 Gy
Reference Point Session Dosage Given: 2 Gy
Session Number: 33

## 2021-08-16 ENCOUNTER — Other Ambulatory Visit: Payer: Self-pay

## 2021-08-16 ENCOUNTER — Ambulatory Visit
Admission: RE | Admit: 2021-08-16 | Discharge: 2021-08-16 | Disposition: A | Discharge status: Home / Self Care | Payer: Medicare Other | Source: Ambulatory Visit | Attending: Radiation Oncology | Admitting: Radiation Oncology

## 2021-08-16 ENCOUNTER — Ambulatory Visit: Payer: Medicare Other

## 2021-08-16 DIAGNOSIS — Z191 Hormone sensitive malignancy status: Secondary | ICD-10-CM | POA: Diagnosis not present

## 2021-08-16 DIAGNOSIS — Z510 Encounter for antineoplastic radiation therapy: Secondary | ICD-10-CM | POA: Diagnosis not present

## 2021-08-16 DIAGNOSIS — C61 Malignant neoplasm of prostate: Secondary | ICD-10-CM | POA: Diagnosis not present

## 2021-08-16 LAB — RAD ONC ARIA SESSION SUMMARY
Course Elapsed Days: 57
Plan Fractions Treated to Date: 9
Plan Prescribed Dose Per Fraction: 2 Gy
Plan Total Fractions Prescribed: 15
Plan Total Prescribed Dose: 30 Gy
Reference Point Dosage Given to Date: 63 Gy
Reference Point Session Dosage Given: 2 Gy
Session Number: 34

## 2021-08-17 ENCOUNTER — Other Ambulatory Visit: Payer: Self-pay

## 2021-08-17 ENCOUNTER — Ambulatory Visit: Payer: Medicare Other

## 2021-08-17 ENCOUNTER — Ambulatory Visit
Admission: RE | Admit: 2021-08-17 | Discharge: 2021-08-17 | Disposition: A | Discharge status: Home / Self Care | Payer: Medicare Other | Source: Ambulatory Visit | Attending: Radiation Oncology | Admitting: Radiation Oncology

## 2021-08-17 DIAGNOSIS — Z191 Hormone sensitive malignancy status: Secondary | ICD-10-CM | POA: Diagnosis not present

## 2021-08-17 DIAGNOSIS — Z510 Encounter for antineoplastic radiation therapy: Secondary | ICD-10-CM | POA: Diagnosis not present

## 2021-08-17 DIAGNOSIS — C61 Malignant neoplasm of prostate: Secondary | ICD-10-CM | POA: Diagnosis not present

## 2021-08-17 LAB — RAD ONC ARIA SESSION SUMMARY
Course Elapsed Days: 58
Plan Fractions Treated to Date: 10
Plan Prescribed Dose Per Fraction: 2 Gy
Plan Total Fractions Prescribed: 15
Plan Total Prescribed Dose: 30 Gy
Reference Point Dosage Given to Date: 65 Gy
Reference Point Session Dosage Given: 2 Gy
Session Number: 35

## 2021-08-18 ENCOUNTER — Ambulatory Visit: Payer: Medicare Other

## 2021-08-18 ENCOUNTER — Other Ambulatory Visit: Payer: Self-pay

## 2021-08-18 ENCOUNTER — Ambulatory Visit
Admission: RE | Admit: 2021-08-18 | Discharge: 2021-08-18 | Disposition: A | Discharge status: Home / Self Care | Payer: Medicare Other | Source: Ambulatory Visit | Attending: Radiation Oncology | Admitting: Radiation Oncology

## 2021-08-18 DIAGNOSIS — Z510 Encounter for antineoplastic radiation therapy: Secondary | ICD-10-CM | POA: Diagnosis not present

## 2021-08-18 DIAGNOSIS — C61 Malignant neoplasm of prostate: Secondary | ICD-10-CM | POA: Diagnosis not present

## 2021-08-18 DIAGNOSIS — Z191 Hormone sensitive malignancy status: Secondary | ICD-10-CM | POA: Diagnosis not present

## 2021-08-18 LAB — RAD ONC ARIA SESSION SUMMARY
Course Elapsed Days: 59
Plan Fractions Treated to Date: 11
Plan Prescribed Dose Per Fraction: 2 Gy
Plan Total Fractions Prescribed: 15
Plan Total Prescribed Dose: 30 Gy
Reference Point Dosage Given to Date: 67 Gy
Reference Point Session Dosage Given: 2 Gy
Session Number: 36

## 2021-08-19 ENCOUNTER — Ambulatory Visit: Payer: Medicare Other

## 2021-08-19 ENCOUNTER — Ambulatory Visit
Admission: RE | Admit: 2021-08-19 | Discharge: 2021-08-19 | Disposition: A | Discharge status: Home / Self Care | Payer: Medicare Other | Source: Ambulatory Visit | Attending: Radiation Oncology | Admitting: Radiation Oncology

## 2021-08-19 ENCOUNTER — Other Ambulatory Visit: Payer: Self-pay

## 2021-08-19 DIAGNOSIS — Z510 Encounter for antineoplastic radiation therapy: Secondary | ICD-10-CM | POA: Diagnosis not present

## 2021-08-19 DIAGNOSIS — E782 Mixed hyperlipidemia: Secondary | ICD-10-CM | POA: Diagnosis not present

## 2021-08-19 DIAGNOSIS — I10 Essential (primary) hypertension: Secondary | ICD-10-CM | POA: Diagnosis not present

## 2021-08-19 DIAGNOSIS — Z191 Hormone sensitive malignancy status: Secondary | ICD-10-CM | POA: Diagnosis not present

## 2021-08-19 DIAGNOSIS — E1165 Type 2 diabetes mellitus with hyperglycemia: Secondary | ICD-10-CM | POA: Diagnosis not present

## 2021-08-19 DIAGNOSIS — E039 Hypothyroidism, unspecified: Secondary | ICD-10-CM | POA: Diagnosis not present

## 2021-08-19 DIAGNOSIS — C61 Malignant neoplasm of prostate: Secondary | ICD-10-CM | POA: Diagnosis not present

## 2021-08-19 LAB — RAD ONC ARIA SESSION SUMMARY
Course Elapsed Days: 60
Plan Fractions Treated to Date: 12
Plan Prescribed Dose Per Fraction: 2 Gy
Plan Total Fractions Prescribed: 15
Plan Total Prescribed Dose: 30 Gy
Reference Point Dosage Given to Date: 69 Gy
Reference Point Session Dosage Given: 2 Gy
Session Number: 37

## 2021-08-20 DIAGNOSIS — G4733 Obstructive sleep apnea (adult) (pediatric): Secondary | ICD-10-CM | POA: Diagnosis not present

## 2021-08-22 ENCOUNTER — Ambulatory Visit: Payer: Medicare Other

## 2021-08-22 ENCOUNTER — Ambulatory Visit
Admission: RE | Admit: 2021-08-22 | Discharge: 2021-08-22 | Disposition: A | Discharge status: Home / Self Care | Payer: Medicare Other | Source: Ambulatory Visit | Attending: Radiation Oncology | Admitting: Radiation Oncology

## 2021-08-22 ENCOUNTER — Other Ambulatory Visit: Payer: Self-pay

## 2021-08-22 DIAGNOSIS — C61 Malignant neoplasm of prostate: Secondary | ICD-10-CM | POA: Diagnosis not present

## 2021-08-22 DIAGNOSIS — Z510 Encounter for antineoplastic radiation therapy: Secondary | ICD-10-CM | POA: Diagnosis not present

## 2021-08-22 DIAGNOSIS — Z191 Hormone sensitive malignancy status: Secondary | ICD-10-CM | POA: Diagnosis not present

## 2021-08-22 LAB — RAD ONC ARIA SESSION SUMMARY
Course Elapsed Days: 63
Plan Fractions Treated to Date: 13
Plan Prescribed Dose Per Fraction: 2 Gy
Plan Total Fractions Prescribed: 15
Plan Total Prescribed Dose: 30 Gy
Reference Point Dosage Given to Date: 71 Gy
Reference Point Session Dosage Given: 2 Gy
Session Number: 38

## 2021-08-23 ENCOUNTER — Ambulatory Visit: Payer: Medicare Other

## 2021-08-24 ENCOUNTER — Ambulatory Visit: Payer: Medicare Other

## 2021-08-24 ENCOUNTER — Other Ambulatory Visit: Payer: Self-pay

## 2021-08-24 ENCOUNTER — Ambulatory Visit
Admission: RE | Admit: 2021-08-24 | Discharge: 2021-08-24 | Disposition: A | Discharge status: Home / Self Care | Payer: Medicare Other | Source: Ambulatory Visit | Attending: Radiation Oncology | Admitting: Radiation Oncology

## 2021-08-24 DIAGNOSIS — Z510 Encounter for antineoplastic radiation therapy: Secondary | ICD-10-CM | POA: Diagnosis not present

## 2021-08-24 DIAGNOSIS — C61 Malignant neoplasm of prostate: Secondary | ICD-10-CM | POA: Diagnosis not present

## 2021-08-24 DIAGNOSIS — Z191 Hormone sensitive malignancy status: Secondary | ICD-10-CM | POA: Diagnosis not present

## 2021-08-24 LAB — RAD ONC ARIA SESSION SUMMARY
Course Elapsed Days: 65
Plan Fractions Treated to Date: 14
Plan Prescribed Dose Per Fraction: 2 Gy
Plan Total Fractions Prescribed: 15
Plan Total Prescribed Dose: 30 Gy
Reference Point Dosage Given to Date: 73 Gy
Reference Point Session Dosage Given: 2 Gy
Session Number: 39

## 2021-08-25 ENCOUNTER — Ambulatory Visit: Payer: Medicare Other

## 2021-08-25 ENCOUNTER — Ambulatory Visit
Admission: RE | Admit: 2021-08-25 | Discharge: 2021-08-25 | Disposition: A | Discharge status: Home / Self Care | Payer: Medicare Other | Source: Ambulatory Visit | Attending: Radiation Oncology | Admitting: Radiation Oncology

## 2021-08-25 ENCOUNTER — Encounter: Payer: Self-pay | Admitting: Urology

## 2021-08-25 ENCOUNTER — Other Ambulatory Visit: Payer: Self-pay

## 2021-08-25 DIAGNOSIS — C61 Malignant neoplasm of prostate: Secondary | ICD-10-CM

## 2021-08-25 DIAGNOSIS — Z191 Hormone sensitive malignancy status: Secondary | ICD-10-CM | POA: Diagnosis not present

## 2021-08-25 DIAGNOSIS — Z510 Encounter for antineoplastic radiation therapy: Secondary | ICD-10-CM | POA: Diagnosis not present

## 2021-08-25 LAB — RAD ONC ARIA SESSION SUMMARY
Course Elapsed Days: 66
Plan Fractions Treated to Date: 15
Plan Prescribed Dose Per Fraction: 2 Gy
Plan Total Fractions Prescribed: 15
Plan Total Prescribed Dose: 30 Gy
Reference Point Dosage Given to Date: 75 Gy
Reference Point Session Dosage Given: 2 Gy
Session Number: 40

## 2021-08-27 ENCOUNTER — Ambulatory Visit: Payer: Medicare Other

## 2021-09-21 DIAGNOSIS — E782 Mixed hyperlipidemia: Secondary | ICD-10-CM | POA: Diagnosis not present

## 2021-09-21 DIAGNOSIS — E1165 Type 2 diabetes mellitus with hyperglycemia: Secondary | ICD-10-CM | POA: Diagnosis not present

## 2021-09-21 DIAGNOSIS — E039 Hypothyroidism, unspecified: Secondary | ICD-10-CM | POA: Diagnosis not present

## 2021-09-21 DIAGNOSIS — I10 Essential (primary) hypertension: Secondary | ICD-10-CM | POA: Diagnosis not present

## 2021-09-27 DIAGNOSIS — Z1211 Encounter for screening for malignant neoplasm of colon: Secondary | ICD-10-CM | POA: Diagnosis not present

## 2021-09-27 DIAGNOSIS — I10 Essential (primary) hypertension: Secondary | ICD-10-CM | POA: Diagnosis not present

## 2021-09-27 DIAGNOSIS — E559 Vitamin D deficiency, unspecified: Secondary | ICD-10-CM | POA: Diagnosis not present

## 2021-09-27 DIAGNOSIS — E782 Mixed hyperlipidemia: Secondary | ICD-10-CM | POA: Diagnosis not present

## 2021-09-27 DIAGNOSIS — E1165 Type 2 diabetes mellitus with hyperglycemia: Secondary | ICD-10-CM | POA: Diagnosis not present

## 2021-09-27 DIAGNOSIS — E039 Hypothyroidism, unspecified: Secondary | ICD-10-CM | POA: Diagnosis not present

## 2021-09-27 DIAGNOSIS — G4733 Obstructive sleep apnea (adult) (pediatric): Secondary | ICD-10-CM | POA: Diagnosis not present

## 2021-09-27 DIAGNOSIS — R011 Cardiac murmur, unspecified: Secondary | ICD-10-CM | POA: Diagnosis not present

## 2021-10-05 ENCOUNTER — Encounter: Payer: Self-pay | Admitting: Urology

## 2021-10-05 DIAGNOSIS — Z1211 Encounter for screening for malignant neoplasm of colon: Secondary | ICD-10-CM | POA: Diagnosis not present

## 2021-10-05 DIAGNOSIS — R011 Cardiac murmur, unspecified: Secondary | ICD-10-CM | POA: Diagnosis not present

## 2021-10-05 NOTE — Progress Notes (Signed)
Telephone appointment. I spoke w/ patient's spouse Mrs. Dacoda Finlay, verified her identity and began nursing interview. She reports patient is having some urinary urgency. No other issues reported at this time.  Meaningful use complete. I-PSS score of 6-mild. Flomax as directed. Urology appt-10/05/21-4pm.  Reminded spouse of patient's 10:30am-10/06/21 telephone appointment w/ Ashlyn Bruning PA-C. I left my extension [phone] in case patient needs anything. Spouse verbalized understanding.  Patient contact [phone]

## 2021-10-06 ENCOUNTER — Ambulatory Visit
Admission: RE | Admit: 2021-10-06 | Discharge: 2021-10-06 | Disposition: A | Discharge status: Home / Self Care | Payer: Medicare Other | Source: Ambulatory Visit | Attending: Urology | Admitting: Urology

## 2021-10-06 DIAGNOSIS — C61 Malignant neoplasm of prostate: Secondary | ICD-10-CM

## 2021-10-06 NOTE — Progress Notes (Signed)
Radiation Oncology         (336) [phone] ________________________________  Name: Cameron Moran MRN: [id number]  Date: 10/06/2021  DOB: 1951/01/13  Post Treatment Note  CC: Maury Dus, MD  Irine Seal, MD  Diagnosis:    71 y.o. 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  Interval Since Last Radiation:  6 weeks (concurrent with LT-ADT) 06/20/21 - 08/25/21: 1. The prostate, seminal vesicles, and pelvic lymph nodes were initially treated to 45 Gy in 25 fractions of 1.8 Gy  2. The prostate only was boosted to 75 Gy with 15 additional fractions of 2.0 Gy   Narrative:  I spoke with the patient to conduct his routine scheduled 1 month follow up visit via telephone to spare the patient unnecessary potential exposure in the healthcare setting during the current COVID-19 pandemic.  The patient was notified in advance and gave permission to proceed with this visit format.  He tolerated radiation treatment relatively well with only minor urinary irritation and modest fatigue.  He did report increased urgency, nocturia 5-6x/night and weak flow of stream despite taking Flomax daily as prescribed. He also reported some dysuria that resolved with a course of antibiotics despite negative C&S. Occasional diarrhea/loose stools managed with Imodium prn.                              On review of systems, the patient states that he is doing well in general. He continues with some urgency and weak stream but feels that the LUTS are gradually improving. He specifically denies gross hematuria, dysuria, straining to void, incomplete emptying or incontinence. He is tolerating the ADT fairly well and got a recent Eligard ADT injection on 09/21/21. He reports a healthy appetite and is maintaining his weight. He denies abdominal pain, N/V/D or constipation. His energy level remains low but overall, he is pleased with his progress to date.  ALLERGIES:  is allergic to  doxycycline calcium and penicillin v potassium.  Meds: Current Outpatient Medications  Medication Sig Dispense Refill   albuterol (VENTOLIN HFA) 108 (90 Base) MCG/ACT inhaler Inhale 2 puffs into the lungs every 6 (six) hours as needed for wheezing or shortness of breath.     allopurinol (ZYLOPRIM) 300 MG tablet Take 300 mg by mouth daily as needed (gout).     ALPRAZolam (XANAX) 0.25 MG tablet Take 0.25 mg by mouth 2 (two) times daily as needed for anxiety.     atorvastatin (LIPITOR) 40 MG tablet Take 1 tablet (40 mg total) by mouth daily. 90 tablet 3   Blood Glucose Monitoring Suppl (CONTOUR NEXT EZ) w/Device KIT See admin instructions.     buPROPion (WELLBUTRIN XL) 300 MG 24 hr tablet 300 mg daily.     Calcium Carb-Cholecalciferol (CALCIUM 600 + D PO) Take 1 tablet by mouth 2 (two) times daily.     citalopram (CELEXA) 20 MG tablet Take 20 mg by mouth daily.     HYDROcodone-acetaminophen (NORCO/VICODIN) 5-325 MG tablet Take 2 tablets by mouth 2 (two) times daily.     iron polysaccharides (NIFEREX) 150 MG capsule Take 300 mg by mouth daily.     Levothyroxine Sodium 112 MCG CAPS Take 224 mcg by mouth daily with breakfast.      lisinopril (PRINIVIL,ZESTRIL) 10 MG tablet Take 10 mg by mouth daily.     loperamide (IMODIUM A-D) 2 MG tablet Take 1 tablet (  2 mg total) by mouth 4 (four) times daily as needed for diarrhea or loose stools. (Patient not taking: Reported on 05/04/2021) 30 tablet 0   Melatonin 10 MG CAPS Take 20 mg by mouth at bedtime.     metFORMIN (GLUCOPHAGE) 1000 MG tablet Take 1,000 mg by mouth daily.     Naphazoline HCl (CLEAR EYES OP) Place 1 drop into both eyes daily as needed (allergies).     NUBEQA 300 MG tablet Take 600 mg by mouth 2 (two) times daily.     polyethylene glycol (MIRALAX / GLYCOLAX) 17 g packet Take 17 g by mouth daily as needed for moderate constipation.     Semaglutide, 1 MG/DOSE, (OZEMPIC, 1 MG/DOSE,) 2 MG/1.5ML SOPN Inject 1 mg into the muscle every Monday.      tamsulosin (FLOMAX) 0.4 MG CAPS capsule Take 0.8 mg by mouth at bedtime.     triamcinolone cream (KENALOG) 0.1 % Apply 1 application topically 2 (two) times daily as needed (itching).     No current facility-administered medications for this encounter.    Physical Findings:  vitals were not taken for this visit.  Pain Assessment Pain Score: 0-No pain/10 Unable to assess due to telephone follow up visit format.  Lab Findings: Lab Results  Component Value Date   WBC 5.3 07/14/2021   HGB 11.1 (L) 07/14/2021   HCT 33.5 (L) 07/14/2021   MCV 96.8 07/14/2021   PLT 164 07/14/2021     Radiographic Findings: No results found.  Impression/Plan: 1.  71 y.o. 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 He will continue to follow up with urology for ongoing PSA determinations and had a recent follow up visit with Dr. Junious Silk on 09/21/21 when he got another 6 month Eligard injection. He understands what to expect with regards to PSA monitoring going forward. I will look forward to following his response to treatment via correspondence with urology, and would be happy to continue to participate in his care if clinically indicated. I talked to the patient about what to expect in the future, including his risk for erectile dysfunction and rectal bleeding. I encouraged him to call or return to the office if he has any questions regarding his previous radiation or possible radiation side effects. He was comfortable with this plan and will follow up as needed.     Nicholos Johns, PA-C

## 2021-10-06 NOTE — Progress Notes (Signed)
  Radiation Oncology         [phone]) [phone] ________________________________  Name: [name] MRN: [id number]  Date: 08/25/2021  DOB: [birth year]  End of Treatment Note  Diagnosis:   71 y.o. 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 The prostate, seminal vesicles, and pelvic lymph nodes were initially treated to 45 Gy in 25 fractions of 1.8 Gy  2. The prostate only was boosted to 75 Gy with 15 additional fractions of 2.0 Gy   Beams/energy:  1. The prostate, seminal vesicles, and pelvic lymph nodes were initially treated using VMAT intensity modulated radiotherapy delivering 6 megavolt photons. Image guidance was performed with CB-CT studies prior to each fraction. He was immobilized with a body fix lower extremity mold.  2. the prostate only was boosted using VMAT intensity modulated radiotherapy delivering 6 megavolt photons. Image guidance was performed with CB-CT studies prior to each fraction. He was immobilized with a body fix lower extremity mold.  Narrative: The patient tolerated radiation treatment relatively well with only minor urinary irritation and modest fatigue.  He did report increased urgency, nocturia 5-6x/night and weak flow of stream despite taking Flomax daily as prescribed. He also reported some dysuria that resolved with a course of antibiotics despite negative C&S. Occasional diarrhea/loose stools managed with Imodium prn.  Plan: The patient has completed radiation treatment. He will return to radiation oncology clinic for routine followup in one month. I advised him to call or return sooner if he has any questions or concerns related to his recovery or treatment. ________________________________  Cameron Moran. Cameron Moran, M.D.

## 2021-10-08 DIAGNOSIS — G4733 Obstructive sleep apnea (adult) (pediatric): Secondary | ICD-10-CM | POA: Diagnosis not present

## 2021-10-27 ENCOUNTER — Other Ambulatory Visit: Payer: Self-pay

## 2021-10-27 ENCOUNTER — Emergency Department (HOSPITAL_COMMUNITY): Payer: Medicare Other

## 2021-10-27 ENCOUNTER — Encounter (HOSPITAL_COMMUNITY): Payer: Self-pay

## 2021-10-27 ENCOUNTER — Emergency Department (HOSPITAL_COMMUNITY)
Admission: EM | Admit: 2021-10-27 | Discharge: 2021-10-28 | Disposition: A | Discharge status: Home / Self Care | Payer: Medicare Other | Attending: Emergency Medicine | Admitting: Emergency Medicine

## 2021-10-27 DIAGNOSIS — R1110 Vomiting, unspecified: Secondary | ICD-10-CM | POA: Diagnosis not present

## 2021-10-27 DIAGNOSIS — R109 Unspecified abdominal pain: Secondary | ICD-10-CM | POA: Diagnosis not present

## 2021-10-27 DIAGNOSIS — Z20822 Contact with and (suspected) exposure to covid-19: Secondary | ICD-10-CM | POA: Diagnosis not present

## 2021-10-27 DIAGNOSIS — M25461 Effusion, right knee: Secondary | ICD-10-CM | POA: Diagnosis not present

## 2021-10-27 DIAGNOSIS — Y92008 Other place in unspecified non-institutional (private) residence as the place of occurrence of the external cause: Secondary | ICD-10-CM | POA: Diagnosis not present

## 2021-10-27 DIAGNOSIS — C61 Malignant neoplasm of prostate: Secondary | ICD-10-CM

## 2021-10-27 DIAGNOSIS — R609 Edema, unspecified: Secondary | ICD-10-CM | POA: Diagnosis not present

## 2021-10-27 DIAGNOSIS — W19XXXA Unspecified fall, initial encounter: Secondary | ICD-10-CM | POA: Diagnosis not present

## 2021-10-27 DIAGNOSIS — N3289 Other specified disorders of bladder: Secondary | ICD-10-CM | POA: Diagnosis not present

## 2021-10-27 DIAGNOSIS — W1839XA Other fall on same level, initial encounter: Secondary | ICD-10-CM | POA: Diagnosis not present

## 2021-10-27 DIAGNOSIS — R531 Weakness: Secondary | ICD-10-CM | POA: Insufficient documentation

## 2021-10-27 DIAGNOSIS — Z7984 Long term (current) use of oral hypoglycemic drugs: Secondary | ICD-10-CM | POA: Insufficient documentation

## 2021-10-27 DIAGNOSIS — Z743 Need for continuous supervision: Secondary | ICD-10-CM | POA: Diagnosis not present

## 2021-10-27 DIAGNOSIS — I10 Essential (primary) hypertension: Secondary | ICD-10-CM | POA: Insufficient documentation

## 2021-10-27 DIAGNOSIS — Z79899 Other long term (current) drug therapy: Secondary | ICD-10-CM | POA: Diagnosis not present

## 2021-10-27 DIAGNOSIS — Z8546 Personal history of malignant neoplasm of prostate: Secondary | ICD-10-CM | POA: Diagnosis not present

## 2021-10-27 DIAGNOSIS — M25462 Effusion, left knee: Secondary | ICD-10-CM | POA: Diagnosis not present

## 2021-10-27 HISTORY — DX: Malignant neoplasm of prostate: C61

## 2021-10-27 LAB — COMPREHENSIVE METABOLIC PANEL
ALT: 11 U/L (ref 0–44)
AST: 13 U/L — ABNORMAL LOW (ref 15–41)
Albumin: 3.4 g/dL — ABNORMAL LOW (ref 3.5–5.0)
Alkaline Phosphatase: 49 U/L (ref 38–126)
Anion gap: 10 (ref 5–15)
BUN: 17 mg/dL (ref 8–23)
CO2: 25 mmol/L (ref 22–32)
Calcium: 9.5 mg/dL (ref 8.9–10.3)
Chloride: 101 mmol/L (ref 98–111)
Creatinine, Ser: 1.04 mg/dL (ref 0.61–1.24)
GFR, Estimated: >60 mL/min (ref >60)
Glucose, Bld: 124 mg/dL — ABNORMAL HIGH (ref 70–99)
Potassium: 4.3 mmol/L (ref 3.5–5.1)
Sodium: 136 mmol/L (ref 135–145)
Total Bilirubin: 0.9 mg/dL (ref 0.3–1.2)
Total Protein: 7.6 g/dL (ref 6.5–8.1)

## 2021-10-27 LAB — CBC WITH DIFFERENTIAL/PLATELET
Abs Immature Granulocytes: 0.01 10*3/uL (ref 0.00–0.07)
Basophils Absolute: 0 10*3/uL (ref 0.0–0.1)
Basophils Relative: 0 %
Eosinophils Absolute: 0.1 10*3/uL (ref 0.0–0.5)
Eosinophils Relative: 2 %
HCT: 38.5 % — ABNORMAL LOW (ref 39.0–52.0)
Hemoglobin: 12.9 g/dL — ABNORMAL LOW (ref 13.0–17.0)
Immature Granulocytes: 0 %
Lymphocytes Relative: 12 %
Lymphs Abs: 0.9 10*3/uL (ref 0.7–4.0)
MCH: 31.9 pg (ref 26.0–34.0)
MCHC: 33.5 g/dL (ref 30.0–36.0)
MCV: 95.3 fL (ref 80.0–100.0)
Monocytes Absolute: 0.8 10*3/uL (ref 0.1–1.0)
Monocytes Relative: 11 %
Neutro Abs: 6 10*3/uL (ref 1.7–7.7)
Neutrophils Relative %: 75 %
Platelets: 231 10*3/uL (ref 150–400)
RBC: 4.04 MIL/uL — ABNORMAL LOW (ref 4.22–5.81)
RDW: 12.4 % (ref 11.5–15.5)
WBC: 7.9 10*3/uL (ref 4.0–10.5)
nRBC: 0 % (ref 0.0–0.2)

## 2021-10-27 LAB — CK: Total CK: 34 U/L — ABNORMAL LOW (ref 49–397)

## 2021-10-27 LAB — SARS CORONAVIRUS 2 BY RT PCR: SARS Coronavirus 2 by RT PCR: NEGATIVE

## 2021-10-27 MED ORDER — HYDROCODONE-ACETAMINOPHEN 5-325 MG PO TABS
2.0000 | ORAL_TABLET | Freq: Two times a day (BID) | ORAL | Status: DC
  Administered 2021-10-27 – 2021-10-28 (×2): 2 via ORAL
  Filled 2021-10-27 (×2): qty 2

## 2021-10-27 MED ORDER — DAROLUTAMIDE 300 MG PO TABS: 600.0000 mg | ORAL_TABLET | Freq: Two times a day (BID) | ORAL | Status: DC

## 2021-10-27 MED ORDER — SODIUM CHLORIDE 0.9 % IV BOLUS
1000.0000 mL | Freq: Once | INTRAVENOUS | Status: AC
  Administered 2021-10-27: 1000 mL via INTRAVENOUS

## 2021-10-27 MED ORDER — LISINOPRIL 10 MG PO TABS
10.0000 mg | ORAL_TABLET | Freq: Every day | ORAL | Status: DC
  Administered 2021-10-28: 10 mg via ORAL
  Filled 2021-10-27: qty 1

## 2021-10-27 MED ORDER — ONDANSETRON HCL 4 MG/2ML IJ SOLN
4.0000 mg | Freq: Once | INTRAMUSCULAR | Status: AC
  Administered 2021-10-27: 4 mg via INTRAVENOUS
  Filled 2021-10-27: qty 2

## 2021-10-27 MED ORDER — METFORMIN HCL 500 MG PO TABS: 1000.0000 mg | ORAL_TABLET | Freq: Every day | ORAL | Status: DC

## 2021-10-27 MED ORDER — BUPROPION HCL ER (XL) 150 MG PO TB24
300.0000 mg | ORAL_TABLET | Freq: Every day | ORAL | Status: DC
  Administered 2021-10-28: 300 mg via ORAL
  Filled 2021-10-27: qty 2

## 2021-10-27 MED ORDER — IOHEXOL 300 MG/ML  SOLN
100.0000 mL | Freq: Once | INTRAMUSCULAR | Status: AC | PRN
  Administered 2021-10-27: 100 mL via INTRAVENOUS

## 2021-10-27 MED ORDER — ALPRAZOLAM 0.25 MG PO TABS: 0.2500 mg | ORAL_TABLET | Freq: Two times a day (BID) | ORAL | Status: DC | PRN

## 2021-10-27 MED ORDER — LEVOTHYROXINE SODIUM 112 MCG PO TABS
224.0000 ug | ORAL_TABLET | Freq: Every day | ORAL | Status: DC
  Administered 2021-10-28: 224 ug via ORAL
  Filled 2021-10-27: qty 2

## 2021-10-27 MED ORDER — TAMSULOSIN HCL 0.4 MG PO CAPS
0.8000 mg | ORAL_CAPSULE | Freq: Every day | ORAL | Status: DC
  Administered 2021-10-27: 0.8 mg via ORAL
  Filled 2021-10-27: qty 2

## 2021-10-27 NOTE — ED Triage Notes (Addendum)
Per EMS- Patient fell on his porch and landed on both knees. Patient states he has generalized weakness because he is not eating due to knee pain.  Patient states he has had bad knees, neuropathy, and has been having frequent falls. Patient's wife called on the phone and told the ED Secretary that she wants the patient to go to a Rehab facility.

## 2021-10-27 NOTE — ED Provider Triage Note (Signed)
Emergency Medicine Provider Triage Evaluation Note  Cameron Moran , a 71 y.o. male  was evaluated in triage.  Pt complains of pain in both knees.  Pt reports he fell on Saturday.  Pt reports he has or been eating or drinking since fall  Review of Systems  Positive: weakness Negative: fever  Physical Exam  BP 102/68   Pulse 96   Temp 97.7 F (36.5 C) (Oral)   Resp 16   SpO2 96%  Gen:   Awake, no distress   Resp:  Normal effort  MSK:   Swollen bilat knees  right greater than left  Other:    Medical Decision Making  Medically screening exam initiated at 4:52 PM.  Appropriate orders placed.  Cameron Moran was informed that the remainder of the evaluation will be completed by another provider, this initial triage assessment does not replace that evaluation, and the importance of remaining in the ED until their evaluation is complete.     Fransico Meadow, [place] 10/27/21 1654
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
## 2021-10-27 NOTE — Social Work (Signed)
CSW spoke to Pt wife Santiago Glad [phone] The wife is aware of next steps for the Pt. The PT order was put in they will see Pt in the am. CSW completed FL2. CSW in the AM just will send out when PT has done the evaluation.

## 2021-10-27 NOTE — [place] FL2 (Signed)
[place] MEDICAID FL2 LEVEL OF CARE SCREENING TOOL     IDENTIFICATION  Patient Name: Cameron Moran Birthdate: 1950/03/25 Sex: male Admission Date (Current Location): 10/27/2021  [facility] and [place] Number:  Herbalist and Address:  [facility],  [place], [place]      Provider Number: [id number]  Attending Physician Name and Address:  Drenda Freeze, MD  Relative Name and Phone Number:  [facility] [phone]    Current Level of Care: Hospital Recommended Level of Care: [place] Prior Approval Number:    Date Approved/Denied: 10/27/21 PASRR Number: [id number]  Discharge Plan:      Current Diagnoses: Patient Active Problem List   Diagnosis Date Noted   Hematuria 07/08/2021   Hyperkalemia 03/09/2021   Malignant neoplasm of prostate ([place]) 02/28/2021   Acute cystitis 02/25/2021   Benign prostatic hyperplasia 02/25/2021   Dermatitis 02/25/2021   Difficulty in walking, not elsewhere classified 02/25/2021   Gout 02/25/2021   History of fall 02/25/2021   Hyperglycemia due to type 2 diabetes mellitus ([place]) 02/25/2021   Mild intermittent asthma 02/25/2021   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/25/2021   Lower urinary tract symptoms 02/25/2021   Iron deficiency anemia 02/25/2021   Hypothyroidism 02/25/2021   Hypertensive retinopathy 02/25/2021   Vitamin D deficiency 02/25/2021   Skin sensation disturbance 02/25/2021   Peripheral neuropathy 02/25/2021   Pain in unspecified knee 02/25/2021   Osteoarthritis of knee 02/25/2021   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/25/2021   AKI (acute kidney injury) ([place]) 08/13/2018   Hyperlipidemia    CAD (coronary artery disease)    COLONIC POLYPS, HYPERPLASTIC 04/15/2007   Type 2 diabetes mellitus ([place]) 04/15/2007   MORBID OBESITY 04/15/2007   ANXIETY 04/15/2007   DEPRESSION 04/15/2007   Essential hypertension 04/15/2007   CORONARY ARTERY  DISEASE 04/15/2007   ASTHMA 04/15/2007   Sleep apnea 04/15/2007    Orientation RESPIRATION BLADDER Height & Weight     Self, Time, Situation, Place  Normal Continent Weight: (!) 330 lb (149.7 kg) Height:  5' 9.5" (176.5 cm)  BEHAVIORAL SYMPTOMS/MOOD NEUROLOGICAL BOWEL NUTRITION STATUS      Continent Diet  AMBULATORY STATUS COMMUNICATION OF NEEDS Skin   Limited Assist Verbally Normal                       Personal Care Assistance Level of Assistance  Bathing, Feeding, Dressing Bathing Assistance: Limited assistance Feeding assistance: Independent Dressing Assistance: Limited assistance     Functional Limitations Info  Sight, Hearing, Speech Sight Info: Adequate Hearing Info: Adequate Speech Info: Adequate    SPECIAL CARE FACTORS FREQUENCY                       Contractures Contractures Info: Not present    Additional Factors Info  Code Status, Allergies Code Status Info: Prior Allergies Info: Doxycycline Calcium    Penicillin V Potassium           Current Medications (10/27/2021):  This is the current hospital active medication list Current Facility-Administered Medications  Medication Dose Route Frequency Provider Last Rate Last Admin   iohexol (OMNIPAQUE) 300 MG/ML solution 100 mL  100 mL Intravenous Once PRN Drenda Freeze, MD       Current Outpatient Medications  Medication Sig Dispense Refill   albuterol (VENTOLIN HFA) 108 (90 Base) MCG/ACT inhaler Inhale 2 puffs into the lungs every 6 (six) hours as  needed for wheezing or shortness of breath.     allopurinol (ZYLOPRIM) 300 MG tablet Take 300 mg by mouth daily as needed (gout).     ALPRAZolam (XANAX) 0.25 MG tablet Take 0.25 mg by mouth 2 (two) times daily as needed for anxiety.     atorvastatin (LIPITOR) 40 MG tablet Take 1 tablet (40 mg total) by mouth daily. 90 tablet 3   Blood Glucose Monitoring Suppl (CONTOUR NEXT EZ) w/Device KIT See admin instructions.     buPROPion (WELLBUTRIN XL) 300  MG 24 hr tablet 300 mg daily.     Calcium Carb-Cholecalciferol (CALCIUM 600 + D PO) Take 1 tablet by mouth 2 (two) times daily.     citalopram (CELEXA) 20 MG tablet Take 20 mg by mouth daily.     HYDROcodone-acetaminophen (NORCO/VICODIN) 5-325 MG tablet Take 2 tablets by mouth 2 (two) times daily.     iron polysaccharides (NIFEREX) 150 MG capsule Take 300 mg by mouth daily.     Levothyroxine Sodium 112 MCG CAPS Take 224 mcg by mouth daily with breakfast.      lisinopril (PRINIVIL,ZESTRIL) 10 MG tablet Take 10 mg by mouth daily.     loperamide (IMODIUM A-D) 2 MG tablet Take 1 tablet (2 mg total) by mouth 4 (four) times daily as needed for diarrhea or loose stools. (Patient not taking: Reported on 05/04/2021) 30 tablet 0   Melatonin 10 MG CAPS Take 20 mg by mouth at bedtime.     metFORMIN (GLUCOPHAGE) 1000 MG tablet Take 1,000 mg by mouth daily.     Naphazoline HCl (CLEAR EYES OP) Place 1 drop into both eyes daily as needed (allergies).     NUBEQA 300 MG tablet Take 600 mg by mouth 2 (two) times daily.     polyethylene glycol (MIRALAX / GLYCOLAX) 17 g packet Take 17 g by mouth daily as needed for moderate constipation.     Semaglutide, 1 MG/DOSE, (OZEMPIC, 1 MG/DOSE,) 2 MG/1.5ML SOPN Inject 1 mg into the muscle every Monday.     tamsulosin (FLOMAX) 0.4 MG CAPS capsule Take 0.8 mg by mouth at bedtime.     triamcinolone cream (KENALOG) 0.1 % Apply 1 application topically 2 (two) times daily as needed (itching).       Discharge Medications: Please see discharge summary for a list of discharge medications.  Relevant Imaging Results:  Relevant Lab Results:   Additional Information Pt SSI# [id number]  Rodney Booze, LCSW

## 2021-10-28 DIAGNOSIS — J45909 Unspecified asthma, uncomplicated: Secondary | ICD-10-CM | POA: Diagnosis not present

## 2021-10-28 DIAGNOSIS — I10 Essential (primary) hypertension: Secondary | ICD-10-CM | POA: Diagnosis not present

## 2021-10-28 DIAGNOSIS — E785 Hyperlipidemia, unspecified: Secondary | ICD-10-CM | POA: Diagnosis not present

## 2021-10-28 DIAGNOSIS — E039 Hypothyroidism, unspecified: Secondary | ICD-10-CM | POA: Diagnosis not present

## 2021-10-28 DIAGNOSIS — Z79899 Other long term (current) drug therapy: Secondary | ICD-10-CM | POA: Diagnosis not present

## 2021-10-28 DIAGNOSIS — R2689 Other abnormalities of gait and mobility: Secondary | ICD-10-CM | POA: Diagnosis not present

## 2021-10-28 DIAGNOSIS — R296 Repeated falls: Secondary | ICD-10-CM | POA: Diagnosis not present

## 2021-10-28 DIAGNOSIS — E118 Type 2 diabetes mellitus with unspecified complications: Secondary | ICD-10-CM | POA: Diagnosis not present

## 2021-10-28 DIAGNOSIS — R279 Unspecified lack of coordination: Secondary | ICD-10-CM | POA: Diagnosis not present

## 2021-10-28 DIAGNOSIS — R1319 Other dysphagia: Secondary | ICD-10-CM | POA: Diagnosis not present

## 2021-10-28 DIAGNOSIS — R109 Unspecified abdominal pain: Secondary | ICD-10-CM | POA: Diagnosis not present

## 2021-10-28 DIAGNOSIS — R609 Edema, unspecified: Secondary | ICD-10-CM | POA: Diagnosis not present

## 2021-10-28 DIAGNOSIS — M25469 Effusion, unspecified knee: Secondary | ICD-10-CM | POA: Diagnosis not present

## 2021-10-28 DIAGNOSIS — Z7984 Long term (current) use of oral hypoglycemic drugs: Secondary | ICD-10-CM | POA: Diagnosis not present

## 2021-10-28 DIAGNOSIS — M25461 Effusion, right knee: Secondary | ICD-10-CM | POA: Diagnosis not present

## 2021-10-28 DIAGNOSIS — M109 Gout, unspecified: Secondary | ICD-10-CM | POA: Diagnosis not present

## 2021-10-28 DIAGNOSIS — M170 Bilateral primary osteoarthritis of knee: Secondary | ICD-10-CM | POA: Diagnosis not present

## 2021-10-28 DIAGNOSIS — M25462 Effusion, left knee: Secondary | ICD-10-CM | POA: Diagnosis not present

## 2021-10-28 DIAGNOSIS — F32A Depression, unspecified: Secondary | ICD-10-CM | POA: Diagnosis not present

## 2021-10-28 DIAGNOSIS — G8929 Other chronic pain: Secondary | ICD-10-CM | POA: Diagnosis not present

## 2021-10-28 DIAGNOSIS — R6889 Other general symptoms and signs: Secondary | ICD-10-CM | POA: Diagnosis not present

## 2021-10-28 DIAGNOSIS — E119 Type 2 diabetes mellitus without complications: Secondary | ICD-10-CM | POA: Diagnosis not present

## 2021-10-28 DIAGNOSIS — R531 Weakness: Secondary | ICD-10-CM | POA: Diagnosis not present

## 2021-10-28 DIAGNOSIS — Z20822 Contact with and (suspected) exposure to covid-19: Secondary | ICD-10-CM | POA: Diagnosis not present

## 2021-10-28 DIAGNOSIS — W19XXXA Unspecified fall, initial encounter: Secondary | ICD-10-CM | POA: Diagnosis not present

## 2021-10-28 DIAGNOSIS — M6281 Muscle weakness (generalized): Secondary | ICD-10-CM | POA: Diagnosis not present

## 2021-10-28 DIAGNOSIS — Z7401 Bed confinement status: Secondary | ICD-10-CM | POA: Diagnosis not present

## 2021-10-28 MED ORDER — METFORMIN HCL 500 MG PO TABS
1000.0000 mg | ORAL_TABLET | Freq: Every day | ORAL | Status: DC
  Administered 2021-10-28: 1000 mg via ORAL
  Filled 2021-10-28: qty 2

## 2021-10-28 NOTE — ED Notes (Signed)
Report given to [place] at [facility].

## 2021-10-28 NOTE — ED Notes (Signed)
Pt continues to rest, lights down low for comfort measures, observe even RR and unlabored, NAD noted, VSS, side rails up x2 for safety, plan of care ongoing, call light within reach, no further concerns as of present.

## 2021-10-28 NOTE — ED Notes (Signed)
[facility] called for transport.  

## 2021-10-28 NOTE — Evaluation (Signed)
Physical Therapy Evaluation Patient Details Name: Cameron Moran MRN: [id number] DOB: April 04, 1950 Today's Date: 10/28/2021  History of Present Illness  Cameron Moran is a 71 y.o. male presents with recent fall. xray negative for fractures bil knees. PMH: HTN, prostate cancer, anxiety, arthritis, diabetes, morbid obesity  Clinical Impression  Pt admitted with above diagnosis. Pt from home with spouse, reports decline over the past couple of years but was able to ambulate with RW, rollator or SPC. Pt with multiple falls this year, but has become essentially bed bound for 1 week due bil knee pain from recent fall. Pt needing mod A for supine<>sit for trunk and BLE management. Pt unable to perform STS transfer for safety concerns, elevated gurney and +1 assist with pt fearful of falling- will continue to assess when in adjustable hospital bed. Recommend SNF for ST strengthening prior to return home with spouse. Pt currently with functional limitations due to the deficits listed below (see PT Problem List). Pt will benefit from skilled PT to increase their independence and safety with mobility to allow discharge to the venue listed below.          Recommendations for follow up therapy are one component of a multi-disciplinary discharge planning process, led by the attending physician.  Recommendations may be updated based on patient status, additional functional criteria and insurance authorization.  Follow Up Recommendations Skilled nursing-short term rehab (<3 hours/day) Can patient physically be transported by private vehicle: No    Assistance Recommended at Discharge Frequent or constant Supervision/Assistance  Patient can return home with the following  A lot of help with bathing/dressing/bathroom;Assistance with cooking/housework;Assist for transportation;Help with stairs or ramp for entrance;Two people to help with walking and/or transfers    Equipment Recommendations None recommended by PT   Recommendations for Other Services       Functional Status Assessment Patient has had a recent decline in their functional status and demonstrates the ability to make significant improvements in function in a reasonable and predictable amount of time.     Precautions / Restrictions Precautions Precautions: Fall Restrictions Weight Bearing Restrictions: No      Mobility  Bed Mobility Overal bed mobility: Needs Assistance Bed Mobility: Supine to Sit, Sit to Supine  Supine to sit: Mod assist Sit to supine: Mod assist  General bed mobility comments: mod A to upright trunk into sitting, mod A to lift BLE back into supine    Transfers  General transfer comment: not attempted due to safety, high gurney and pt fearful of falling with +1 assist    Ambulation/Gait                  Stairs            Wheelchair Mobility    Modified Rankin (Stroke Patients Only)       Balance Overall balance assessment: Needs assistance, History of Falls Sitting-balance support: Feet unsupported, Bilateral upper extremity supported Sitting balance-Leahy Scale: Fair        Pertinent Vitals/Pain Pain Assessment Pain Assessment: 0-10 Pain Score: 7  Pain Location: bil knees at rest (10/10 with mobility) Pain Descriptors / Indicators: Aching Pain Intervention(s): Limited activity within patient's tolerance, Monitored during session    Home Living Family/patient expects to be discharged to:: Private residence Living Arrangements: Spouse/significant other Available Help at Discharge: Family;Available 24 hours/day Type of Home: House Home Access: Stairs to enter Entrance Stairs-Rails: None Entrance Stairs-Number of Steps: 2   Home Layout: One level Home Equipment: Cane -  single point;Rollator (4 wheels);Rolling Walker (2 wheels);Grab bars - tub/shower      Prior Function Prior Level of Function : History of Falls (last six months);Needs assist  Mobility Comments: pt reports  decline in mobility for years, was able to ambulate short distances with AD until ~1 week ago, has been in bed since then; reports multiple falls over the past year ADLs Comments: pt reports ind with self care tasks until ~1 week ago, spouse shares household chores     Hand Dominance   Dominant Hand: Right    Extremity/Trunk Assessment   Upper Extremity Assessment Upper Extremity Assessment: Overall WFL for tasks assessed    Lower Extremity Assessment Lower Extremity Assessment: Generalized weakness;RLE deficits/detail;LLE deficits/detail RLE Deficits / Details: AROM WNL, strength 3/5 throughout, numbness/tingling in foot RLE Sensation: history of peripheral neuropathy RLE Coordination: WNL LLE Deficits / Details: AROM WNL, strength 3/5 throughout, numbness/tingling in foot LLE Sensation: history of peripheral neuropathy LLE Coordination: WNL    Cervical / Trunk Assessment Cervical / Trunk Assessment: Normal  Communication   Communication: No difficulties  Cognition Arousal/Alertness: Awake/alert Behavior During Therapy: WFL for tasks assessed/performed Overall Cognitive Status: Within Functional Limits for tasks assessed    General Comments      Exercises     Assessment/Plan    PT Assessment Patient needs continued PT services  PT Problem List Decreased strength;Decreased activity tolerance;Decreased balance;Decreased mobility;Decreased knowledge of use of DME;Obesity;Impaired sensation       PT Treatment Interventions DME instruction;Gait training;Functional mobility training;Therapeutic activities;Therapeutic exercise;Neuromuscular re-education;Patient/family education    PT Goals (Current goals can be found in the Care Plan section)  Acute Rehab PT Goals Patient Stated Goal: get stronger and return home PT Goal Formulation: With patient Time For Goal Achievement: 11/11/21 Potential to Achieve Goals: Good    Frequency Min 2X/week     Co-evaluation                AM-PAC PT "6 Clicks" Mobility  Outcome Measure Help needed turning from your back to your side while in a flat bed without using bedrails?: A Little Help needed moving from lying on your back to sitting on the side of a flat bed without using bedrails?: A Little Help needed moving to and from a bed to a chair (including a wheelchair)?: Total Help needed standing up from a chair using your arms (e.g., wheelchair or bedside chair)?: Total Help needed to walk in hospital room?: Total Help needed climbing 3-5 steps with a railing? : Total 6 Click Score: 10    End of Session   Activity Tolerance: Patient tolerated treatment well Patient left: in bed;with call bell/phone within reach Nurse Communication: Mobility status PT Visit Diagnosis: Other abnormalities of gait and mobility (R26.89);Muscle weakness (generalized) (M62.81);History of falling (Z91.81)    Time: 5277-8242 PT Time Calculation (min) (ACUTE ONLY): 18 min   Charges:   PT Evaluation $PT Eval Low Complexity: 1 Low           Tori Marshall Kampf PT, DPT 10/28/21, 10:36 AM

## 2021-10-28 NOTE — ED Notes (Signed)
Pt appears to be sleeping, observe even RR and unlabored, NAD noted, side rails up x2 for safety,TOC on going, PT in the am, call light within reach, no further concerns as of present.

## 2021-10-28 NOTE — Progress Notes (Signed)
Awaiting PT eval. TOC following.

## 2021-10-28 NOTE — Progress Notes (Signed)
FL2 Sent out, awaiting bed offers, TOC will update.

## 2021-10-28 NOTE — Progress Notes (Signed)
Pt was accepted into [facility], Nurse made aware to call PTAR.  RM number is [phone] A Report Number is [phone] ask to speak to [place] [facility] Nurse.

## 2021-10-28 NOTE — ED Provider Notes (Signed)
Emergency Medicine Observation Re-evaluation Note  Cameron Moran is a 71 y.o. male, seen on rounds today.  Pt initially presented to the ED for complaints of Fall and Knee Pain Currently, the patient is no acute complaints.  Physical Exam  BP 107/72   Pulse 81   Temp 97.9 F (36.6 C) (Oral)   Resp 18   Ht 1.765 m (5' 9.5")   Wt (!) 149.7 kg   SpO2 98%   BMI 48.03 kg/m  Physical Exam   ED Course / MDM  EKG:   I have reviewed the labs performed to date as well as medications administered while in observation.  Recent changes in the last 24 hours include none.  Plan  Current plan is for to nursing home.    Lacretia Leigh, MD 10/28/21 [phone]

## 2021-10-28 NOTE — ED Notes (Signed)
Pt continues to rest, lights down low for comfort measures, this RN observe even RR and unlabored, NAD noted, VSS, side rails up x2 for safety, plan of care ongoing, call light within reach, no further concerns as of present.

## 2021-10-28 NOTE — ED Notes (Signed)
Pt appears to be sleeping, observe even RR and unlabored, NAD noted, side rails up x2 for safety, plan of care ongoing, call light within reach, no further concerns as of present.   

## 2021-10-31 DIAGNOSIS — I10 Essential (primary) hypertension: Secondary | ICD-10-CM | POA: Diagnosis not present

## 2021-10-31 DIAGNOSIS — R296 Repeated falls: Secondary | ICD-10-CM | POA: Diagnosis not present

## 2021-10-31 DIAGNOSIS — E039 Hypothyroidism, unspecified: Secondary | ICD-10-CM | POA: Diagnosis not present

## 2021-10-31 DIAGNOSIS — F32A Depression, unspecified: Secondary | ICD-10-CM | POA: Diagnosis not present

## 2021-10-31 DIAGNOSIS — E119 Type 2 diabetes mellitus without complications: Secondary | ICD-10-CM | POA: Diagnosis not present

## 2021-10-31 DIAGNOSIS — G8929 Other chronic pain: Secondary | ICD-10-CM | POA: Diagnosis not present

## 2021-10-31 DIAGNOSIS — E785 Hyperlipidemia, unspecified: Secondary | ICD-10-CM | POA: Diagnosis not present

## 2021-11-04 DIAGNOSIS — E785 Hyperlipidemia, unspecified: Secondary | ICD-10-CM | POA: Diagnosis not present

## 2021-11-04 DIAGNOSIS — F32A Depression, unspecified: Secondary | ICD-10-CM | POA: Diagnosis not present

## 2021-11-04 DIAGNOSIS — G8929 Other chronic pain: Secondary | ICD-10-CM | POA: Diagnosis not present

## 2021-11-04 DIAGNOSIS — I10 Essential (primary) hypertension: Secondary | ICD-10-CM | POA: Diagnosis not present

## 2021-11-04 DIAGNOSIS — E119 Type 2 diabetes mellitus without complications: Secondary | ICD-10-CM | POA: Diagnosis not present

## 2021-11-04 DIAGNOSIS — E039 Hypothyroidism, unspecified: Secondary | ICD-10-CM | POA: Diagnosis not present

## 2021-11-11 DIAGNOSIS — E039 Hypothyroidism, unspecified: Secondary | ICD-10-CM | POA: Diagnosis not present

## 2021-11-14 ENCOUNTER — Encounter: Payer: Self-pay | Admitting: *Deleted

## 2021-11-23 DIAGNOSIS — E039 Hypothyroidism, unspecified: Secondary | ICD-10-CM | POA: Diagnosis not present

## 2021-11-23 DIAGNOSIS — R5381 Other malaise: Secondary | ICD-10-CM | POA: Diagnosis not present

## 2021-11-26 DIAGNOSIS — E782 Mixed hyperlipidemia: Secondary | ICD-10-CM | POA: Diagnosis not present

## 2021-11-26 DIAGNOSIS — Z7984 Long term (current) use of oral hypoglycemic drugs: Secondary | ICD-10-CM | POA: Diagnosis not present

## 2021-11-26 DIAGNOSIS — E559 Vitamin D deficiency, unspecified: Secondary | ICD-10-CM | POA: Diagnosis not present

## 2021-11-26 DIAGNOSIS — Z9181 History of falling: Secondary | ICD-10-CM | POA: Diagnosis not present

## 2021-11-26 DIAGNOSIS — Z7985 Long-term (current) use of injectable non-insulin antidiabetic drugs: Secondary | ICD-10-CM | POA: Diagnosis not present

## 2021-11-26 DIAGNOSIS — M109 Gout, unspecified: Secondary | ICD-10-CM | POA: Diagnosis not present

## 2021-11-26 DIAGNOSIS — E039 Hypothyroidism, unspecified: Secondary | ICD-10-CM | POA: Diagnosis not present

## 2021-11-26 DIAGNOSIS — R202 Paresthesia of skin: Secondary | ICD-10-CM | POA: Diagnosis not present

## 2021-11-26 DIAGNOSIS — J45909 Unspecified asthma, uncomplicated: Secondary | ICD-10-CM | POA: Diagnosis not present

## 2021-11-26 DIAGNOSIS — D630 Anemia in neoplastic disease: Secondary | ICD-10-CM | POA: Diagnosis not present

## 2021-11-26 DIAGNOSIS — M170 Bilateral primary osteoarthritis of knee: Secondary | ICD-10-CM | POA: Diagnosis not present

## 2021-11-26 DIAGNOSIS — E1142 Type 2 diabetes mellitus with diabetic polyneuropathy: Secondary | ICD-10-CM | POA: Diagnosis not present

## 2021-11-26 DIAGNOSIS — Z87891 Personal history of nicotine dependence: Secondary | ICD-10-CM | POA: Diagnosis not present

## 2021-11-26 DIAGNOSIS — G4733 Obstructive sleep apnea (adult) (pediatric): Secondary | ICD-10-CM | POA: Diagnosis not present

## 2021-11-26 DIAGNOSIS — I10 Essential (primary) hypertension: Secondary | ICD-10-CM | POA: Diagnosis not present

## 2021-11-26 DIAGNOSIS — Z79891 Long term (current) use of opiate analgesic: Secondary | ICD-10-CM | POA: Diagnosis not present

## 2021-11-26 DIAGNOSIS — M064 Inflammatory polyarthropathy: Secondary | ICD-10-CM | POA: Diagnosis not present

## 2021-11-26 DIAGNOSIS — E1165 Type 2 diabetes mellitus with hyperglycemia: Secondary | ICD-10-CM | POA: Diagnosis not present

## 2021-11-29 ENCOUNTER — Encounter: Payer: Self-pay | Admitting: *Deleted

## 2021-12-08 DIAGNOSIS — D630 Anemia in neoplastic disease: Secondary | ICD-10-CM | POA: Diagnosis not present

## 2021-12-08 DIAGNOSIS — Z79891 Long term (current) use of opiate analgesic: Secondary | ICD-10-CM | POA: Diagnosis not present

## 2021-12-08 DIAGNOSIS — J45909 Unspecified asthma, uncomplicated: Secondary | ICD-10-CM | POA: Diagnosis not present

## 2021-12-08 DIAGNOSIS — I10 Essential (primary) hypertension: Secondary | ICD-10-CM | POA: Diagnosis not present

## 2021-12-08 DIAGNOSIS — E782 Mixed hyperlipidemia: Secondary | ICD-10-CM | POA: Diagnosis not present

## 2021-12-08 DIAGNOSIS — G4733 Obstructive sleep apnea (adult) (pediatric): Secondary | ICD-10-CM | POA: Diagnosis not present

## 2021-12-08 DIAGNOSIS — R202 Paresthesia of skin: Secondary | ICD-10-CM | POA: Diagnosis not present

## 2021-12-08 DIAGNOSIS — Z7985 Long-term (current) use of injectable non-insulin antidiabetic drugs: Secondary | ICD-10-CM | POA: Diagnosis not present

## 2021-12-08 DIAGNOSIS — M170 Bilateral primary osteoarthritis of knee: Secondary | ICD-10-CM | POA: Diagnosis not present

## 2021-12-08 DIAGNOSIS — M109 Gout, unspecified: Secondary | ICD-10-CM | POA: Diagnosis not present

## 2021-12-08 DIAGNOSIS — Z87891 Personal history of nicotine dependence: Secondary | ICD-10-CM | POA: Diagnosis not present

## 2021-12-08 DIAGNOSIS — Z9181 History of falling: Secondary | ICD-10-CM | POA: Diagnosis not present

## 2021-12-08 DIAGNOSIS — E039 Hypothyroidism, unspecified: Secondary | ICD-10-CM | POA: Diagnosis not present

## 2021-12-08 DIAGNOSIS — E1142 Type 2 diabetes mellitus with diabetic polyneuropathy: Secondary | ICD-10-CM | POA: Diagnosis not present

## 2021-12-08 DIAGNOSIS — Z7984 Long term (current) use of oral hypoglycemic drugs: Secondary | ICD-10-CM | POA: Diagnosis not present

## 2021-12-08 DIAGNOSIS — M064 Inflammatory polyarthropathy: Secondary | ICD-10-CM | POA: Diagnosis not present

## 2021-12-08 DIAGNOSIS — E559 Vitamin D deficiency, unspecified: Secondary | ICD-10-CM | POA: Diagnosis not present

## 2021-12-08 DIAGNOSIS — E1165 Type 2 diabetes mellitus with hyperglycemia: Secondary | ICD-10-CM | POA: Diagnosis not present

## 2021-12-09 ENCOUNTER — Encounter: Payer: Self-pay | Admitting: *Deleted

## 2021-12-13 DIAGNOSIS — I10 Essential (primary) hypertension: Secondary | ICD-10-CM | POA: Diagnosis not present

## 2021-12-13 DIAGNOSIS — E1165 Type 2 diabetes mellitus with hyperglycemia: Secondary | ICD-10-CM | POA: Diagnosis not present

## 2021-12-13 DIAGNOSIS — E559 Vitamin D deficiency, unspecified: Secondary | ICD-10-CM | POA: Diagnosis not present

## 2021-12-13 DIAGNOSIS — J45909 Unspecified asthma, uncomplicated: Secondary | ICD-10-CM | POA: Diagnosis not present

## 2021-12-13 DIAGNOSIS — G4733 Obstructive sleep apnea (adult) (pediatric): Secondary | ICD-10-CM | POA: Diagnosis not present

## 2021-12-13 DIAGNOSIS — Z9181 History of falling: Secondary | ICD-10-CM | POA: Diagnosis not present

## 2021-12-13 DIAGNOSIS — Z7984 Long term (current) use of oral hypoglycemic drugs: Secondary | ICD-10-CM | POA: Diagnosis not present

## 2021-12-13 DIAGNOSIS — E039 Hypothyroidism, unspecified: Secondary | ICD-10-CM | POA: Diagnosis not present

## 2021-12-13 DIAGNOSIS — E1142 Type 2 diabetes mellitus with diabetic polyneuropathy: Secondary | ICD-10-CM | POA: Diagnosis not present

## 2021-12-13 DIAGNOSIS — Z87891 Personal history of nicotine dependence: Secondary | ICD-10-CM | POA: Diagnosis not present

## 2021-12-13 DIAGNOSIS — M170 Bilateral primary osteoarthritis of knee: Secondary | ICD-10-CM | POA: Diagnosis not present

## 2021-12-13 DIAGNOSIS — M109 Gout, unspecified: Secondary | ICD-10-CM | POA: Diagnosis not present

## 2021-12-13 DIAGNOSIS — E782 Mixed hyperlipidemia: Secondary | ICD-10-CM | POA: Diagnosis not present

## 2021-12-13 DIAGNOSIS — Z7985 Long-term (current) use of injectable non-insulin antidiabetic drugs: Secondary | ICD-10-CM | POA: Diagnosis not present

## 2021-12-13 DIAGNOSIS — Z79891 Long term (current) use of opiate analgesic: Secondary | ICD-10-CM | POA: Diagnosis not present

## 2021-12-13 DIAGNOSIS — D630 Anemia in neoplastic disease: Secondary | ICD-10-CM | POA: Diagnosis not present

## 2021-12-13 DIAGNOSIS — R202 Paresthesia of skin: Secondary | ICD-10-CM | POA: Diagnosis not present

## 2021-12-13 DIAGNOSIS — M064 Inflammatory polyarthropathy: Secondary | ICD-10-CM | POA: Diagnosis not present

## 2021-12-15 DIAGNOSIS — E782 Mixed hyperlipidemia: Secondary | ICD-10-CM | POA: Diagnosis not present

## 2021-12-15 DIAGNOSIS — E1165 Type 2 diabetes mellitus with hyperglycemia: Secondary | ICD-10-CM | POA: Diagnosis not present

## 2021-12-15 DIAGNOSIS — J4520 Mild intermittent asthma, uncomplicated: Secondary | ICD-10-CM | POA: Diagnosis not present

## 2021-12-15 DIAGNOSIS — I10 Essential (primary) hypertension: Secondary | ICD-10-CM | POA: Diagnosis not present

## 2021-12-15 DIAGNOSIS — M170 Bilateral primary osteoarthritis of knee: Secondary | ICD-10-CM | POA: Diagnosis not present

## 2021-12-15 DIAGNOSIS — E039 Hypothyroidism, unspecified: Secondary | ICD-10-CM | POA: Diagnosis not present

## 2021-12-19 ENCOUNTER — Inpatient Hospital Stay: Payer: Medicare Other | Attending: Urology | Admitting: *Deleted

## 2021-12-19 ENCOUNTER — Encounter: Payer: Self-pay | Admitting: *Deleted

## 2021-12-19 DIAGNOSIS — C61 Malignant neoplasm of prostate: Secondary | ICD-10-CM

## 2021-12-20 NOTE — Progress Notes (Signed)
2 Identifiers were used for verification purposes. No vitals taken at visit as this was a telephone visit.  Pt has chronic  pain to both knees. He rates it an 8/10. He says he has been fatigued as well since starting the ADT. He rates it a 6/10. Pt stated that he does still have much urinary frequency at bedtime. He says he sometimes get up 5 times at night to urinate depending on how much he has had to drink. Pt states, he has to void more than once sometimes to empty bladder completely. Urine stream is weak. Pt is receiving physical therapy at home since the fall he had. He did inpatient rehab at [facility] prior. He is now using a walker to prevent falls. He says mobility has been difficult but he believes PT is helping. Pt has seen PCP recently. Last colonoscopy was 12/2006. Vaccines updated.  SCP reviewed and completed.

## 2021-12-21 DIAGNOSIS — R202 Paresthesia of skin: Secondary | ICD-10-CM | POA: Diagnosis not present

## 2021-12-21 DIAGNOSIS — Z87891 Personal history of nicotine dependence: Secondary | ICD-10-CM | POA: Diagnosis not present

## 2021-12-21 DIAGNOSIS — M170 Bilateral primary osteoarthritis of knee: Secondary | ICD-10-CM | POA: Diagnosis not present

## 2021-12-21 DIAGNOSIS — M109 Gout, unspecified: Secondary | ICD-10-CM | POA: Diagnosis not present

## 2021-12-21 DIAGNOSIS — E039 Hypothyroidism, unspecified: Secondary | ICD-10-CM | POA: Diagnosis not present

## 2021-12-21 DIAGNOSIS — Z9181 History of falling: Secondary | ICD-10-CM | POA: Diagnosis not present

## 2021-12-21 DIAGNOSIS — D630 Anemia in neoplastic disease: Secondary | ICD-10-CM | POA: Diagnosis not present

## 2021-12-21 DIAGNOSIS — M064 Inflammatory polyarthropathy: Secondary | ICD-10-CM | POA: Diagnosis not present

## 2021-12-21 DIAGNOSIS — E782 Mixed hyperlipidemia: Secondary | ICD-10-CM | POA: Diagnosis not present

## 2021-12-21 DIAGNOSIS — Z7985 Long-term (current) use of injectable non-insulin antidiabetic drugs: Secondary | ICD-10-CM | POA: Diagnosis not present

## 2021-12-21 DIAGNOSIS — I10 Essential (primary) hypertension: Secondary | ICD-10-CM | POA: Diagnosis not present

## 2021-12-21 DIAGNOSIS — Z7984 Long term (current) use of oral hypoglycemic drugs: Secondary | ICD-10-CM | POA: Diagnosis not present

## 2021-12-21 DIAGNOSIS — J45909 Unspecified asthma, uncomplicated: Secondary | ICD-10-CM | POA: Diagnosis not present

## 2021-12-21 DIAGNOSIS — E559 Vitamin D deficiency, unspecified: Secondary | ICD-10-CM | POA: Diagnosis not present

## 2021-12-21 DIAGNOSIS — E1165 Type 2 diabetes mellitus with hyperglycemia: Secondary | ICD-10-CM | POA: Diagnosis not present

## 2021-12-21 DIAGNOSIS — Z79891 Long term (current) use of opiate analgesic: Secondary | ICD-10-CM | POA: Diagnosis not present

## 2021-12-21 DIAGNOSIS — G4733 Obstructive sleep apnea (adult) (pediatric): Secondary | ICD-10-CM | POA: Diagnosis not present

## 2021-12-21 DIAGNOSIS — E1142 Type 2 diabetes mellitus with diabetic polyneuropathy: Secondary | ICD-10-CM | POA: Diagnosis not present

## 2021-12-27 DIAGNOSIS — G4733 Obstructive sleep apnea (adult) (pediatric): Secondary | ICD-10-CM | POA: Diagnosis not present

## 2021-12-27 DIAGNOSIS — E559 Vitamin D deficiency, unspecified: Secondary | ICD-10-CM | POA: Diagnosis not present

## 2021-12-27 DIAGNOSIS — M064 Inflammatory polyarthropathy: Secondary | ICD-10-CM | POA: Diagnosis not present

## 2021-12-27 DIAGNOSIS — D630 Anemia in neoplastic disease: Secondary | ICD-10-CM | POA: Diagnosis not present

## 2021-12-27 DIAGNOSIS — Z7984 Long term (current) use of oral hypoglycemic drugs: Secondary | ICD-10-CM | POA: Diagnosis not present

## 2021-12-27 DIAGNOSIS — E1142 Type 2 diabetes mellitus with diabetic polyneuropathy: Secondary | ICD-10-CM | POA: Diagnosis not present

## 2021-12-27 DIAGNOSIS — E039 Hypothyroidism, unspecified: Secondary | ICD-10-CM | POA: Diagnosis not present

## 2021-12-27 DIAGNOSIS — M170 Bilateral primary osteoarthritis of knee: Secondary | ICD-10-CM | POA: Diagnosis not present

## 2021-12-27 DIAGNOSIS — Z7985 Long-term (current) use of injectable non-insulin antidiabetic drugs: Secondary | ICD-10-CM | POA: Diagnosis not present

## 2021-12-27 DIAGNOSIS — I10 Essential (primary) hypertension: Secondary | ICD-10-CM | POA: Diagnosis not present

## 2021-12-27 DIAGNOSIS — E1165 Type 2 diabetes mellitus with hyperglycemia: Secondary | ICD-10-CM | POA: Diagnosis not present

## 2021-12-27 DIAGNOSIS — J45909 Unspecified asthma, uncomplicated: Secondary | ICD-10-CM | POA: Diagnosis not present

## 2021-12-27 DIAGNOSIS — R202 Paresthesia of skin: Secondary | ICD-10-CM | POA: Diagnosis not present

## 2021-12-27 DIAGNOSIS — E782 Mixed hyperlipidemia: Secondary | ICD-10-CM | POA: Diagnosis not present

## 2021-12-27 DIAGNOSIS — M109 Gout, unspecified: Secondary | ICD-10-CM | POA: Diagnosis not present

## 2021-12-27 DIAGNOSIS — Z79891 Long term (current) use of opiate analgesic: Secondary | ICD-10-CM | POA: Diagnosis not present

## 2021-12-27 DIAGNOSIS — Z9181 History of falling: Secondary | ICD-10-CM | POA: Diagnosis not present

## 2021-12-27 DIAGNOSIS — Z87891 Personal history of nicotine dependence: Secondary | ICD-10-CM | POA: Diagnosis not present

## 2022-01-02 DIAGNOSIS — H25042 Posterior subcapsular polar age-related cataract, left eye: Secondary | ICD-10-CM | POA: Diagnosis not present

## 2022-01-02 DIAGNOSIS — H52203 Unspecified astigmatism, bilateral: Secondary | ICD-10-CM | POA: Diagnosis not present

## 2022-01-02 DIAGNOSIS — E119 Type 2 diabetes mellitus without complications: Secondary | ICD-10-CM | POA: Diagnosis not present

## 2022-01-03 DIAGNOSIS — Z87891 Personal history of nicotine dependence: Secondary | ICD-10-CM | POA: Diagnosis not present

## 2022-01-03 DIAGNOSIS — G4733 Obstructive sleep apnea (adult) (pediatric): Secondary | ICD-10-CM | POA: Diagnosis not present

## 2022-01-03 DIAGNOSIS — E1142 Type 2 diabetes mellitus with diabetic polyneuropathy: Secondary | ICD-10-CM | POA: Diagnosis not present

## 2022-01-03 DIAGNOSIS — Z7984 Long term (current) use of oral hypoglycemic drugs: Secondary | ICD-10-CM | POA: Diagnosis not present

## 2022-01-03 DIAGNOSIS — Z7985 Long-term (current) use of injectable non-insulin antidiabetic drugs: Secondary | ICD-10-CM | POA: Diagnosis not present

## 2022-01-03 DIAGNOSIS — I10 Essential (primary) hypertension: Secondary | ICD-10-CM | POA: Diagnosis not present

## 2022-01-03 DIAGNOSIS — E039 Hypothyroidism, unspecified: Secondary | ICD-10-CM | POA: Diagnosis not present

## 2022-01-03 DIAGNOSIS — E559 Vitamin D deficiency, unspecified: Secondary | ICD-10-CM | POA: Diagnosis not present

## 2022-01-03 DIAGNOSIS — M064 Inflammatory polyarthropathy: Secondary | ICD-10-CM | POA: Diagnosis not present

## 2022-01-03 DIAGNOSIS — M170 Bilateral primary osteoarthritis of knee: Secondary | ICD-10-CM | POA: Diagnosis not present

## 2022-01-03 DIAGNOSIS — Z9181 History of falling: Secondary | ICD-10-CM | POA: Diagnosis not present

## 2022-01-03 DIAGNOSIS — E1165 Type 2 diabetes mellitus with hyperglycemia: Secondary | ICD-10-CM | POA: Diagnosis not present

## 2022-01-03 DIAGNOSIS — M109 Gout, unspecified: Secondary | ICD-10-CM | POA: Diagnosis not present

## 2022-01-03 DIAGNOSIS — D630 Anemia in neoplastic disease: Secondary | ICD-10-CM | POA: Diagnosis not present

## 2022-01-03 DIAGNOSIS — J45909 Unspecified asthma, uncomplicated: Secondary | ICD-10-CM | POA: Diagnosis not present

## 2022-01-03 DIAGNOSIS — R202 Paresthesia of skin: Secondary | ICD-10-CM | POA: Diagnosis not present

## 2022-01-03 DIAGNOSIS — E782 Mixed hyperlipidemia: Secondary | ICD-10-CM | POA: Diagnosis not present

## 2022-01-03 DIAGNOSIS — Z79891 Long term (current) use of opiate analgesic: Secondary | ICD-10-CM | POA: Diagnosis not present

## 2022-01-19 DIAGNOSIS — G629 Polyneuropathy, unspecified: Secondary | ICD-10-CM | POA: Diagnosis not present

## 2022-01-19 DIAGNOSIS — I10 Essential (primary) hypertension: Secondary | ICD-10-CM | POA: Diagnosis not present

## 2022-01-19 DIAGNOSIS — J4520 Mild intermittent asthma, uncomplicated: Secondary | ICD-10-CM | POA: Diagnosis not present

## 2022-01-19 DIAGNOSIS — Z23 Encounter for immunization: Secondary | ICD-10-CM | POA: Diagnosis not present

## 2022-01-19 DIAGNOSIS — E039 Hypothyroidism, unspecified: Secondary | ICD-10-CM | POA: Diagnosis not present

## 2022-01-19 DIAGNOSIS — E1165 Type 2 diabetes mellitus with hyperglycemia: Secondary | ICD-10-CM | POA: Diagnosis not present

## 2022-01-30 DIAGNOSIS — E782 Mixed hyperlipidemia: Secondary | ICD-10-CM | POA: Diagnosis not present

## 2022-01-30 DIAGNOSIS — E1165 Type 2 diabetes mellitus with hyperglycemia: Secondary | ICD-10-CM | POA: Diagnosis not present

## 2022-01-30 DIAGNOSIS — I10 Essential (primary) hypertension: Secondary | ICD-10-CM | POA: Diagnosis not present

## 2022-01-30 DIAGNOSIS — E039 Hypothyroidism, unspecified: Secondary | ICD-10-CM | POA: Diagnosis not present

## 2022-01-30 DIAGNOSIS — M170 Bilateral primary osteoarthritis of knee: Secondary | ICD-10-CM | POA: Diagnosis not present

## 2022-02-27 DIAGNOSIS — G629 Polyneuropathy, unspecified: Secondary | ICD-10-CM | POA: Diagnosis not present

## 2022-03-24 DIAGNOSIS — J4520 Mild intermittent asthma, uncomplicated: Secondary | ICD-10-CM | POA: Diagnosis not present

## 2022-03-27 DIAGNOSIS — J4520 Mild intermittent asthma, uncomplicated: Secondary | ICD-10-CM | POA: Diagnosis not present

## 2022-03-30 DIAGNOSIS — Z23 Encounter for immunization: Secondary | ICD-10-CM | POA: Diagnosis not present

## 2022-03-30 DIAGNOSIS — E1149 Type 2 diabetes mellitus with other diabetic neurological complication: Secondary | ICD-10-CM | POA: Diagnosis not present

## 2022-03-30 DIAGNOSIS — G4733 Obstructive sleep apnea (adult) (pediatric): Secondary | ICD-10-CM | POA: Diagnosis not present

## 2022-03-30 DIAGNOSIS — E1165 Type 2 diabetes mellitus with hyperglycemia: Secondary | ICD-10-CM | POA: Diagnosis not present

## 2022-03-30 DIAGNOSIS — E039 Hypothyroidism, unspecified: Secondary | ICD-10-CM | POA: Diagnosis not present

## 2022-04-19 DIAGNOSIS — R3911 Hesitancy of micturition: Secondary | ICD-10-CM | POA: Diagnosis not present

## 2022-04-22 DIAGNOSIS — J4520 Mild intermittent asthma, uncomplicated: Secondary | ICD-10-CM | POA: Diagnosis not present

## 2022-04-27 DIAGNOSIS — E559 Vitamin D deficiency, unspecified: Secondary | ICD-10-CM | POA: Diagnosis not present

## 2022-04-27 DIAGNOSIS — E039 Hypothyroidism, unspecified: Secondary | ICD-10-CM | POA: Diagnosis not present

## 2022-04-27 DIAGNOSIS — E1165 Type 2 diabetes mellitus with hyperglycemia: Secondary | ICD-10-CM | POA: Diagnosis not present

## 2022-04-27 DIAGNOSIS — I10 Essential (primary) hypertension: Secondary | ICD-10-CM | POA: Diagnosis not present

## 2022-04-27 DIAGNOSIS — G629 Polyneuropathy, unspecified: Secondary | ICD-10-CM | POA: Diagnosis not present

## 2022-04-27 DIAGNOSIS — M170 Bilateral primary osteoarthritis of knee: Secondary | ICD-10-CM | POA: Diagnosis not present

## 2022-04-27 DIAGNOSIS — G4733 Obstructive sleep apnea (adult) (pediatric): Secondary | ICD-10-CM | POA: Diagnosis not present

## 2022-04-27 DIAGNOSIS — Z9181 History of falling: Secondary | ICD-10-CM | POA: Diagnosis not present

## 2022-04-27 DIAGNOSIS — E1142 Type 2 diabetes mellitus with diabetic polyneuropathy: Secondary | ICD-10-CM | POA: Diagnosis not present

## 2022-05-23 DIAGNOSIS — J4520 Mild intermittent asthma, uncomplicated: Secondary | ICD-10-CM | POA: Diagnosis not present

## 2022-06-14 DIAGNOSIS — E039 Hypothyroidism, unspecified: Secondary | ICD-10-CM | POA: Diagnosis not present

## 2022-06-14 DIAGNOSIS — N1411 Contrast-induced nephropathy: Secondary | ICD-10-CM | POA: Diagnosis not present

## 2022-06-22 DIAGNOSIS — J4520 Mild intermittent asthma, uncomplicated: Secondary | ICD-10-CM | POA: Diagnosis not present

## 2022-07-23 DIAGNOSIS — J4520 Mild intermittent asthma, uncomplicated: Secondary | ICD-10-CM | POA: Diagnosis not present

## 2022-08-02 DIAGNOSIS — H905 Unspecified sensorineural hearing loss: Secondary | ICD-10-CM | POA: Diagnosis not present

## 2022-08-05 IMAGING — CR DG KNEE COMPLETE 4+V*R*
5 series · 5 of 5 positions shown · non-contrast
Comparison: None Available.

CLINICAL DATA: Fall.  Pain.

EXAM:
RIGHT KNEE - COMPLETE 4+ VIEW

[x knee ap right (1 of 4)]
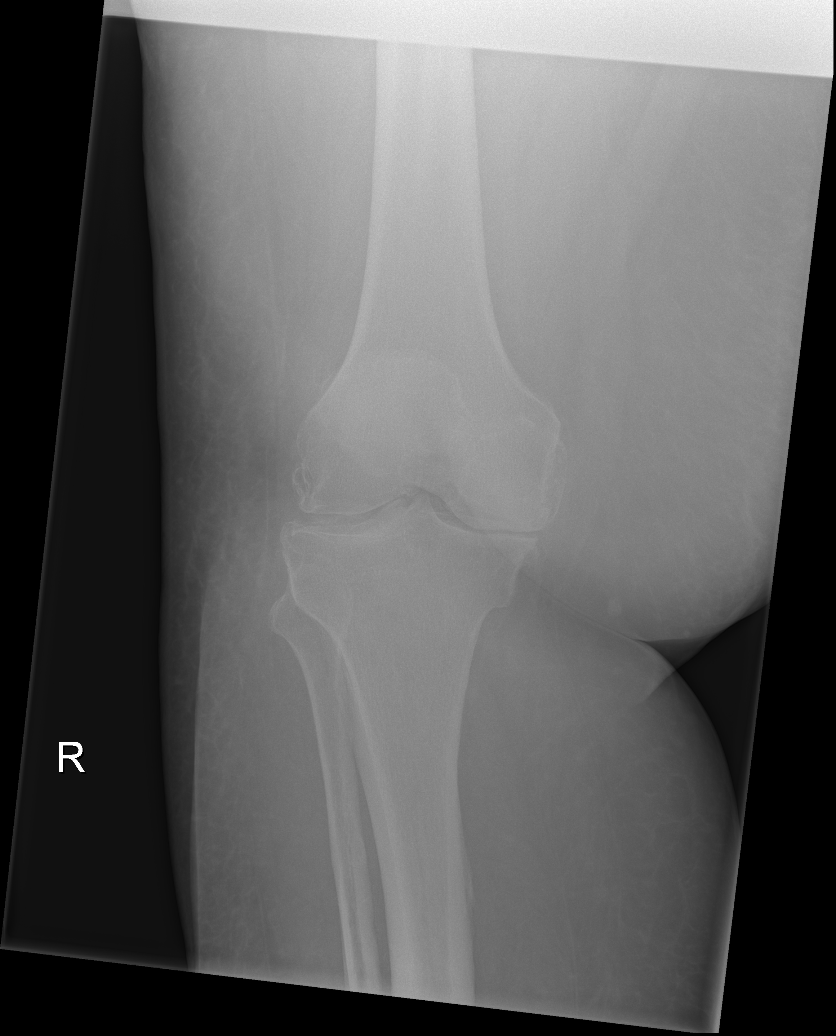

[x knee ap right (2 of 4)]
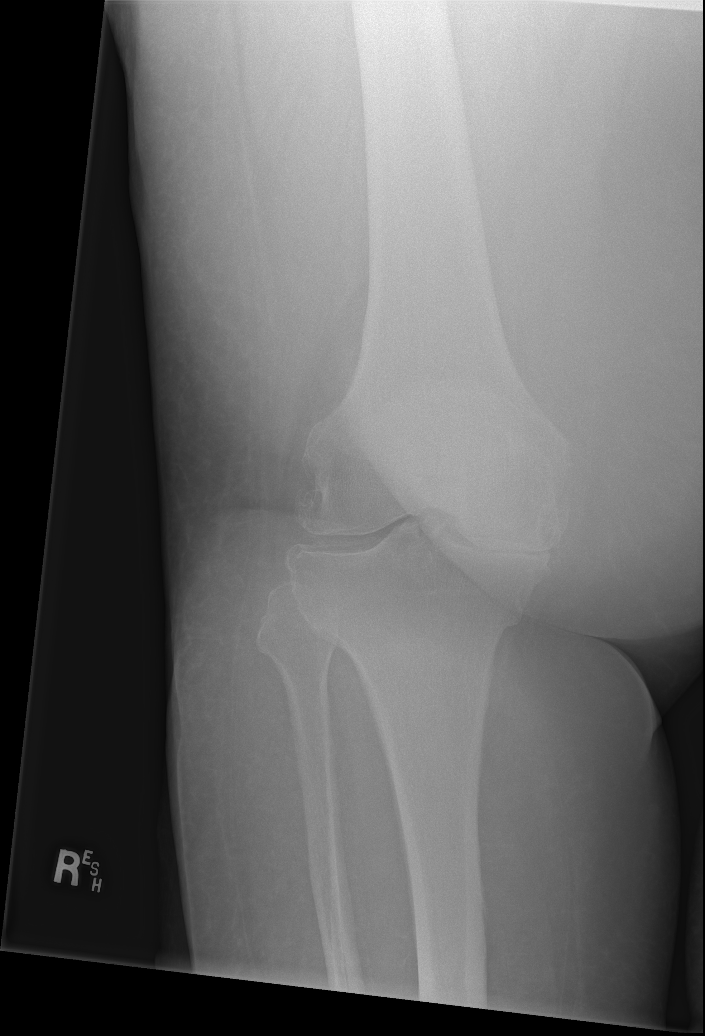

[x knee ap right (3 of 4)]
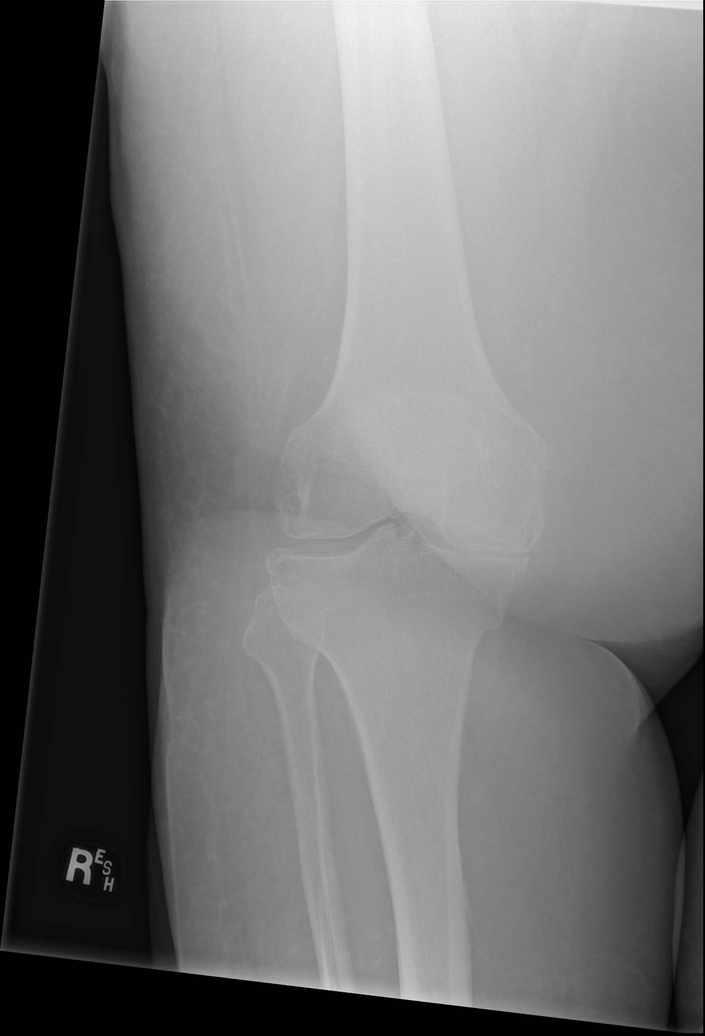

[x knee ap right (4 of 4)]
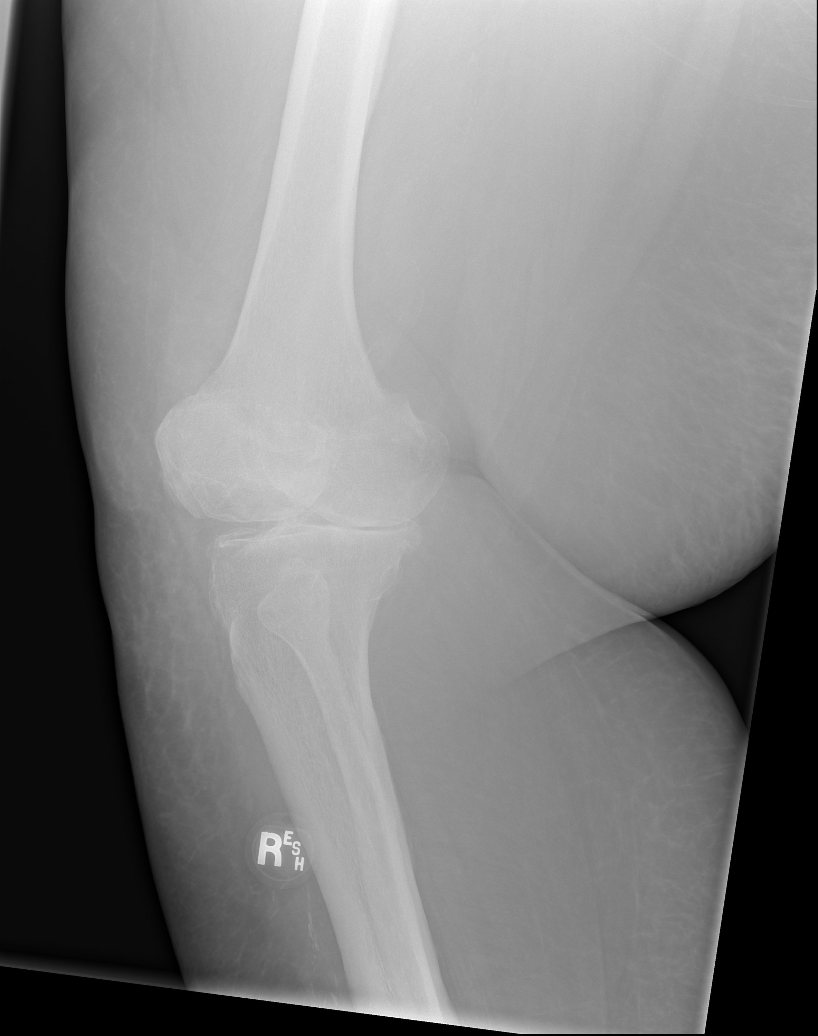

[x knee lat right]
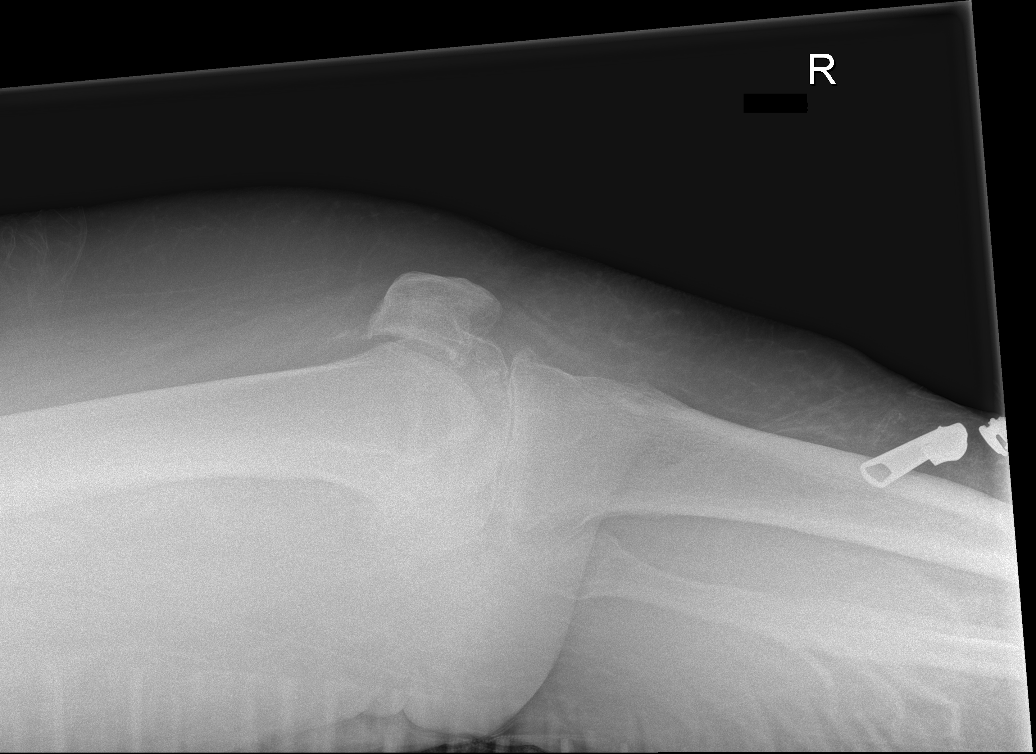

[5 of 5 positions shown; findings below may reference images not displayed]

FINDINGS: There is diffuse decreased bone mineralization. Severe medial
compartment joint space narrowing bone-on-bone contact and mild
peripheral degenerative osteophytes. Mild lateral compartment joint
space narrowing and moderate peripheral osteophytosis. Lateral
compartment chondrocalcinosis. Moderate to severe patellofemoral
joint space narrowing and peripheral osteophytosis. No definite
joint effusion.

No acute fracture or dislocation.
IMPRESSION: Severe medial and moderate to severe patellofemoral compartment
osteoarthritis.

## 2022-08-05 IMAGING — CR DG HIP (WITH OR WITHOUT PELVIS) 2-3V*L*
3 series · 3 of 3 positions shown · non-contrast
Comparison: No pertinent prior exams available for comparison.

CLINICAL DATA: Provided history: Pain, fall.

EXAM:
DG HIP (WITH OR WITHOUT PELVIS) 2-3V LEFT

[x pelvis]
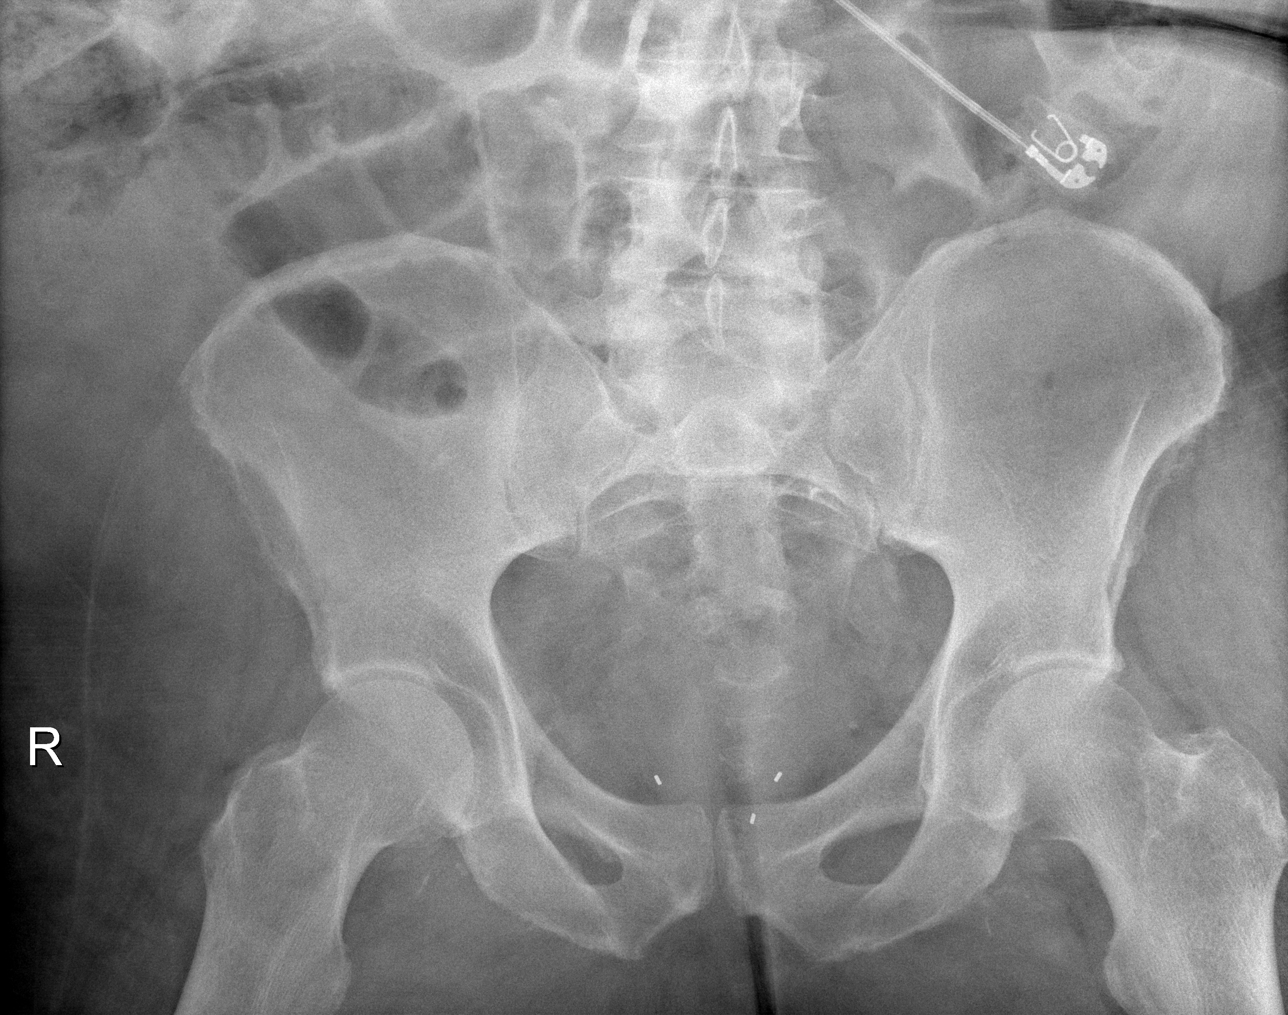

[x hip ap left]
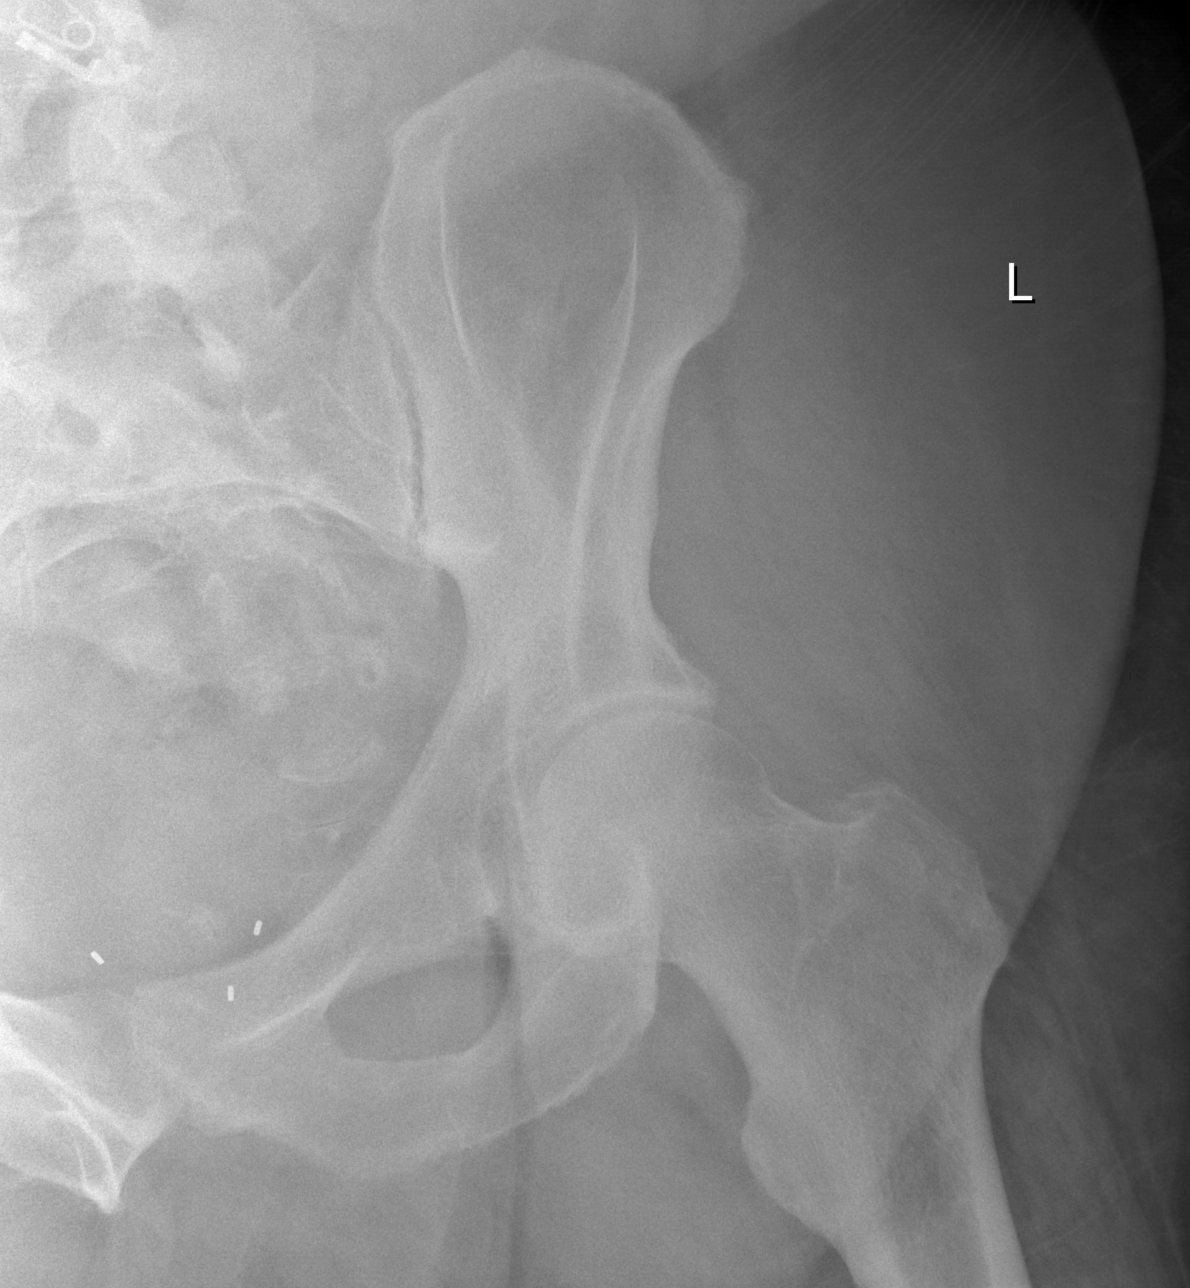

[x hip lat left]
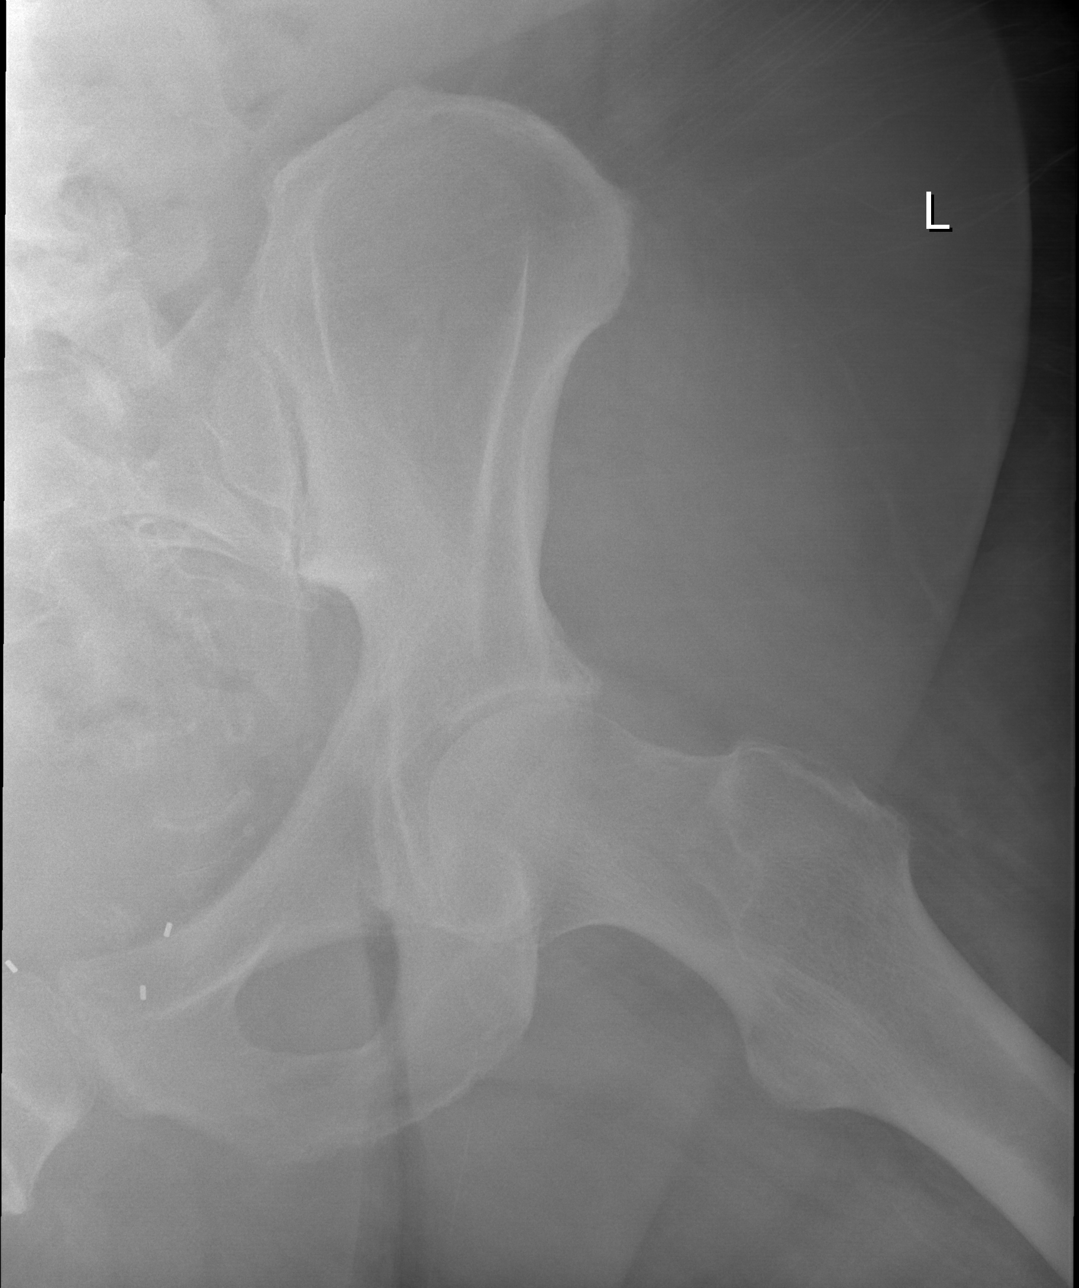

[3 of 3 positions shown; findings below may reference images not displayed]

FINDINGS: There is normal bony alignment.

No evidence of acute osseous or articular abnormality.

Mild bilateral femoroacetabular joint osteoarthrosis.
IMPRESSION: No evidence of acute osseous or articular abnormality.

## 2022-08-05 IMAGING — CT CT CERVICAL SPINE W/O CM
4 series · 15 of 33 positions shown, 18 images · non-contrast
Comparison: None Available.

CLINICAL DATA: Trauma, fall



[Series 4: c spine soft · axial · 0.28mm/px · z∈[-205,-175]mm · 2 of 92 slices shown]
[im 16/92  soft-tissue]
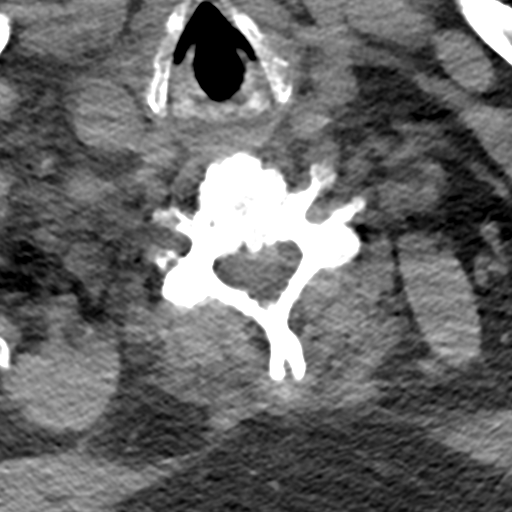
[im 31/92  soft-tissue]
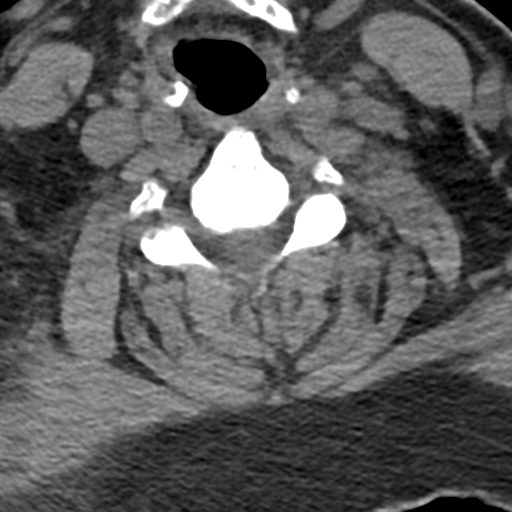

[Series 5: coronal bone · coronal · 0.27mm/px · 3 of 66 slices shown]
[im 14/66  bone]
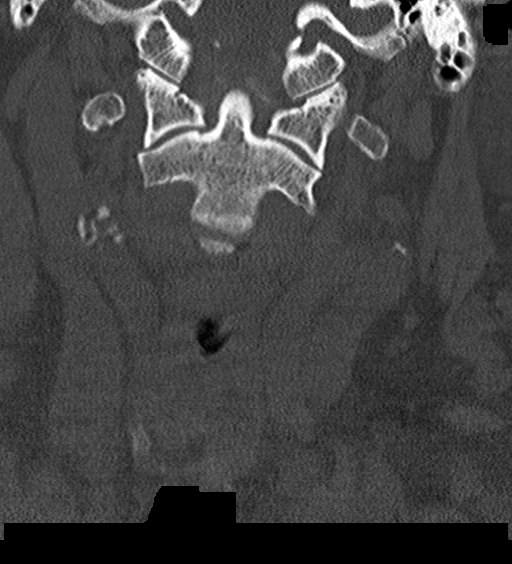
[im 27/66  bone]
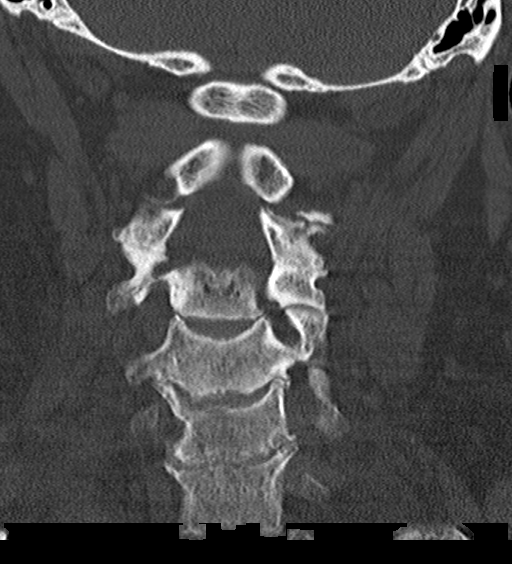
[im 40/66  bone]
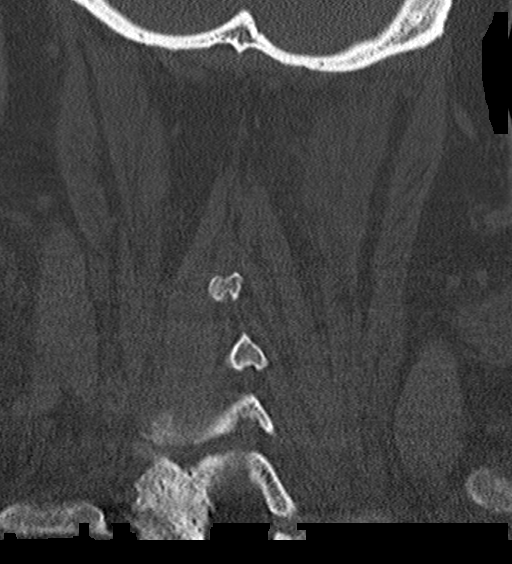

[Series 6: sagittal bone · sagittal · 0.27mm/px · 5 of 61 slices shown, 6 images]
[im 21/61  bone]
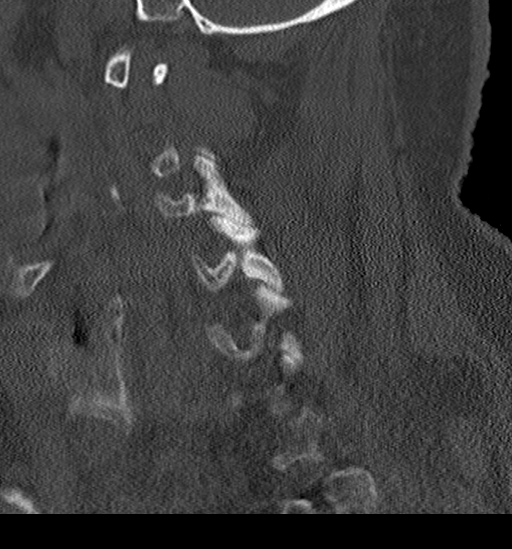
[im 26/61  bone]
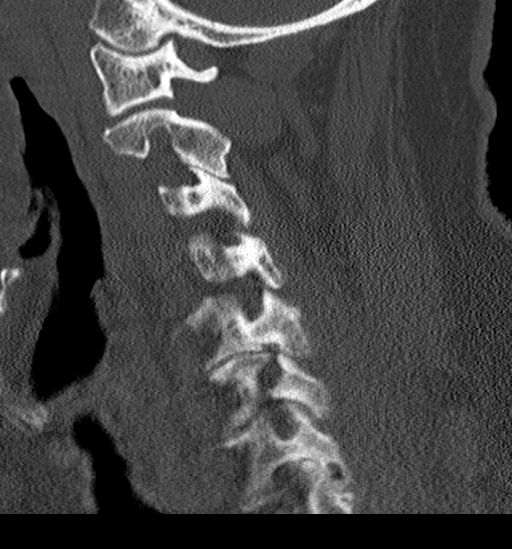
[im 31/61  soft-tissue]
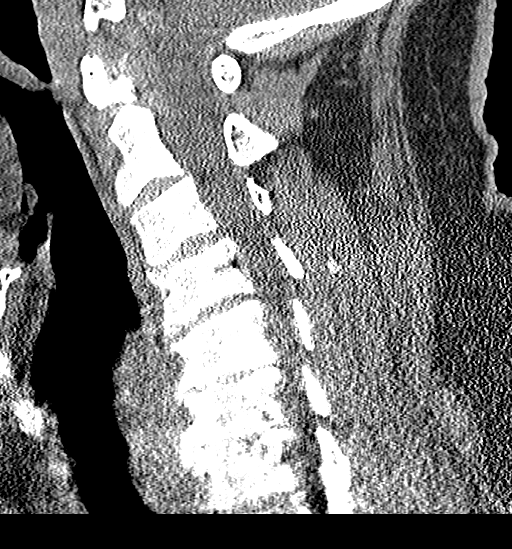
[im 31/61  bone]
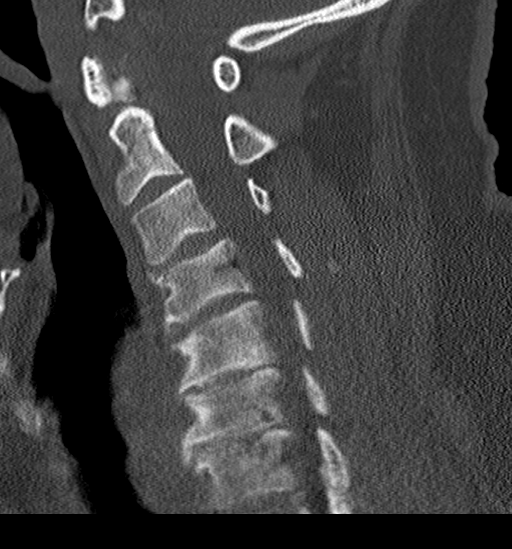
[im 36/61  bone]
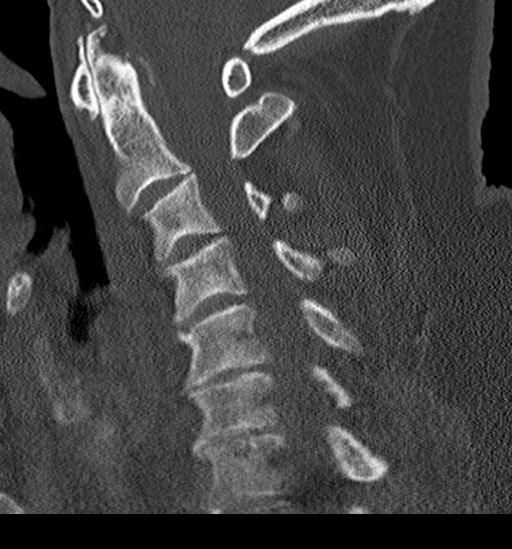
[im 41/61  bone]
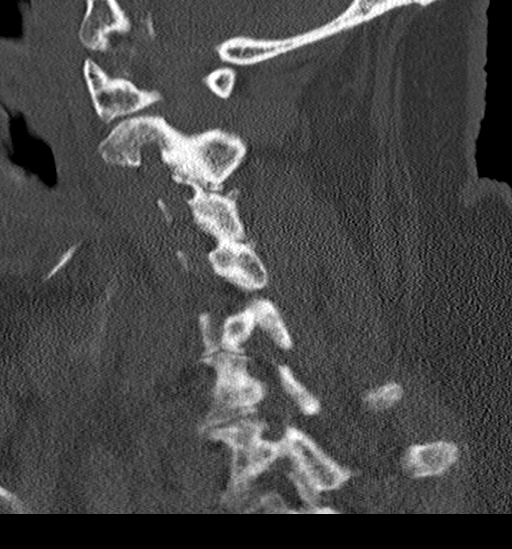

[Series 7: orthogonal bone · axial · 0.23mm/px · z∈[-239,-133]mm · 5 of 83 slices shown, 7 images]
[im 14/83  soft-tissue]
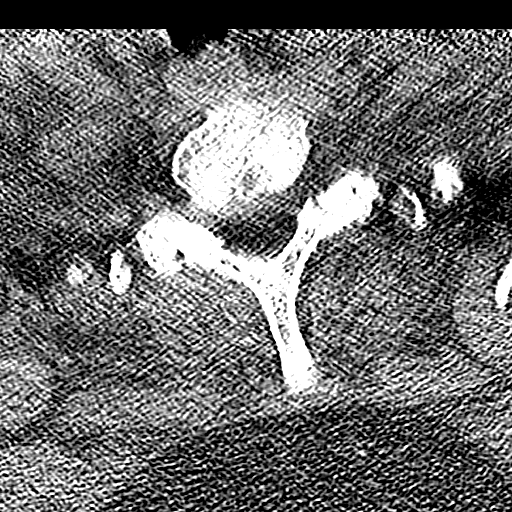
[im 14/83  bone]
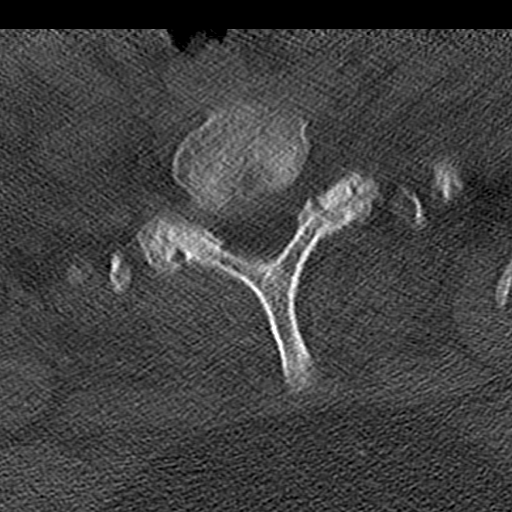
[im 28/83  bone]
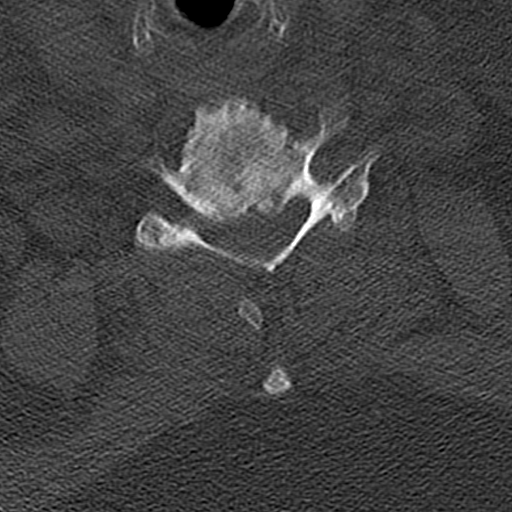
[im 42/83  bone]
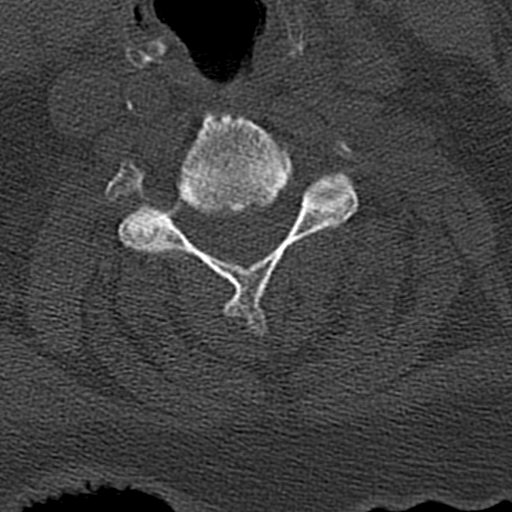
[im 55/83  bone]
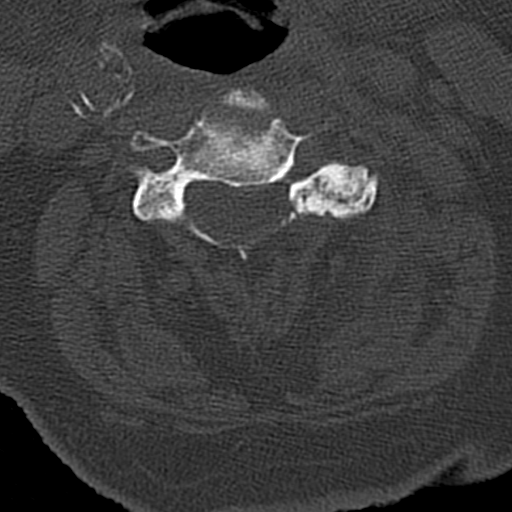
[im 69/83  soft-tissue]
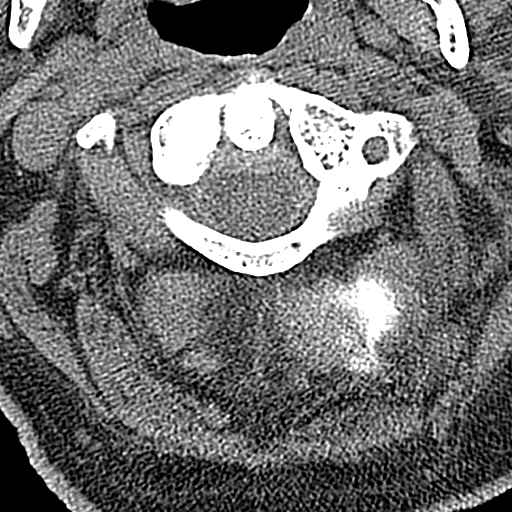
[im 69/83  bone]
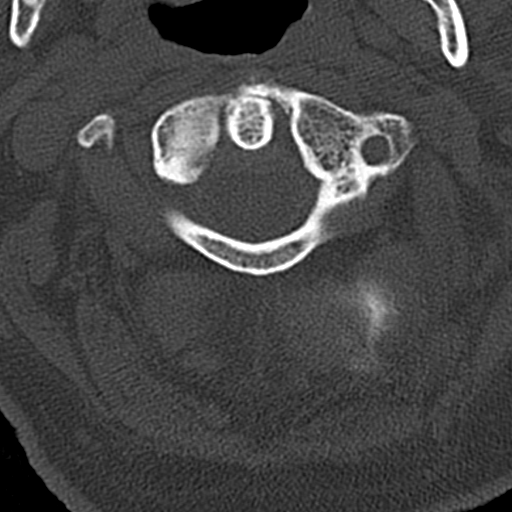

[15 of 33 positions shown; findings below may reference images not displayed]

FINDINGS: Alignment: Alignment of posterior margins of vertebral bodies is
unremarkable.

Skull base and vertebrae: No recent fracture is seen. There is
linear smooth marginated calcification posterior to the spinous
processes of C5 and C6 vertebrae suggesting ligament injury from
previous trauma.

Soft tissues and spinal canal: Posterior bony spurs are causing
extrinsic pressure over the ventral margin of thecal sac. There is
mild spinal stenosis at C5-C6 and C6-C7 levels.

Disc levels: There is encroachment of neural foramina from C2-T1
levels.

Upper chest: Upper lung fields are not included in the images.

Other: Thyroid is not distinctly visualized. There are coarse
calcifications in the carotid arteries, more so at right common
carotid bifurcation.
IMPRESSION: No recent fracture is seen in the cervical spine.

Cervical spondylosis with encroachment of neural foramina from C2-T1
levels.

## 2022-08-10 DIAGNOSIS — E1165 Type 2 diabetes mellitus with hyperglycemia: Secondary | ICD-10-CM | POA: Diagnosis not present

## 2022-08-10 DIAGNOSIS — I10 Essential (primary) hypertension: Secondary | ICD-10-CM | POA: Diagnosis not present

## 2022-08-10 DIAGNOSIS — E782 Mixed hyperlipidemia: Secondary | ICD-10-CM | POA: Diagnosis not present

## 2022-08-10 DIAGNOSIS — E039 Hypothyroidism, unspecified: Secondary | ICD-10-CM | POA: Diagnosis not present

## 2022-08-22 DIAGNOSIS — J4520 Mild intermittent asthma, uncomplicated: Secondary | ICD-10-CM | POA: Diagnosis not present

## 2022-08-28 DIAGNOSIS — E559 Vitamin D deficiency, unspecified: Secondary | ICD-10-CM | POA: Diagnosis not present

## 2022-08-28 DIAGNOSIS — E039 Hypothyroidism, unspecified: Secondary | ICD-10-CM | POA: Diagnosis not present

## 2022-08-30 DIAGNOSIS — Z1211 Encounter for screening for malignant neoplasm of colon: Secondary | ICD-10-CM | POA: Diagnosis not present

## 2022-08-30 DIAGNOSIS — I10 Essential (primary) hypertension: Secondary | ICD-10-CM | POA: Diagnosis not present

## 2022-08-30 DIAGNOSIS — M170 Bilateral primary osteoarthritis of knee: Secondary | ICD-10-CM | POA: Diagnosis not present

## 2022-08-30 DIAGNOSIS — E559 Vitamin D deficiency, unspecified: Secondary | ICD-10-CM | POA: Diagnosis not present

## 2022-08-30 DIAGNOSIS — E1165 Type 2 diabetes mellitus with hyperglycemia: Secondary | ICD-10-CM | POA: Diagnosis not present

## 2022-08-30 DIAGNOSIS — Z9989 Dependence on other enabling machines and devices: Secondary | ICD-10-CM | POA: Diagnosis not present

## 2022-08-30 DIAGNOSIS — M179 Osteoarthritis of knee, unspecified: Secondary | ICD-10-CM | POA: Diagnosis not present

## 2022-08-30 DIAGNOSIS — E1142 Type 2 diabetes mellitus with diabetic polyneuropathy: Secondary | ICD-10-CM | POA: Diagnosis not present

## 2022-09-15 DIAGNOSIS — M170 Bilateral primary osteoarthritis of knee: Secondary | ICD-10-CM | POA: Diagnosis not present

## 2022-09-15 DIAGNOSIS — M25512 Pain in left shoulder: Secondary | ICD-10-CM | POA: Diagnosis not present

## 2022-09-22 DIAGNOSIS — J4520 Mild intermittent asthma, uncomplicated: Secondary | ICD-10-CM | POA: Diagnosis not present

## 2022-10-02 DIAGNOSIS — M170 Bilateral primary osteoarthritis of knee: Secondary | ICD-10-CM | POA: Diagnosis not present

## 2022-10-02 DIAGNOSIS — I10 Essential (primary) hypertension: Secondary | ICD-10-CM | POA: Diagnosis not present

## 2022-10-02 DIAGNOSIS — E039 Hypothyroidism, unspecified: Secondary | ICD-10-CM | POA: Diagnosis not present

## 2022-10-11 DIAGNOSIS — M25512 Pain in left shoulder: Secondary | ICD-10-CM | POA: Diagnosis not present

## 2022-10-11 DIAGNOSIS — M170 Bilateral primary osteoarthritis of knee: Secondary | ICD-10-CM | POA: Diagnosis not present

## 2022-10-17 DIAGNOSIS — E039 Hypothyroidism, unspecified: Secondary | ICD-10-CM | POA: Diagnosis not present

## 2022-10-23 DIAGNOSIS — J4520 Mild intermittent asthma, uncomplicated: Secondary | ICD-10-CM | POA: Diagnosis not present

## 2022-11-06 DIAGNOSIS — H524 Presbyopia: Secondary | ICD-10-CM | POA: Diagnosis not present

## 2022-11-06 DIAGNOSIS — H2513 Age-related nuclear cataract, bilateral: Secondary | ICD-10-CM | POA: Diagnosis not present

## 2022-11-06 DIAGNOSIS — H25043 Posterior subcapsular polar age-related cataract, bilateral: Secondary | ICD-10-CM | POA: Diagnosis not present

## 2022-11-22 DIAGNOSIS — J4520 Mild intermittent asthma, uncomplicated: Secondary | ICD-10-CM | POA: Diagnosis not present

## 2022-12-12 DIAGNOSIS — E039 Hypothyroidism, unspecified: Secondary | ICD-10-CM | POA: Diagnosis not present

## 2022-12-12 DIAGNOSIS — Z23 Encounter for immunization: Secondary | ICD-10-CM | POA: Diagnosis not present

## 2022-12-19 DIAGNOSIS — H2512 Age-related nuclear cataract, left eye: Secondary | ICD-10-CM | POA: Diagnosis not present

## 2022-12-19 DIAGNOSIS — H25042 Posterior subcapsular polar age-related cataract, left eye: Secondary | ICD-10-CM | POA: Diagnosis not present

## 2022-12-19 DIAGNOSIS — H25812 Combined forms of age-related cataract, left eye: Secondary | ICD-10-CM | POA: Diagnosis not present

## 2022-12-23 DIAGNOSIS — J4520 Mild intermittent asthma, uncomplicated: Secondary | ICD-10-CM | POA: Diagnosis not present

## 2023-01-22 DIAGNOSIS — M109 Gout, unspecified: Secondary | ICD-10-CM | POA: Diagnosis not present

## 2023-01-22 DIAGNOSIS — D509 Iron deficiency anemia, unspecified: Secondary | ICD-10-CM | POA: Diagnosis not present

## 2023-01-22 DIAGNOSIS — E559 Vitamin D deficiency, unspecified: Secondary | ICD-10-CM | POA: Diagnosis not present

## 2023-01-22 DIAGNOSIS — M170 Bilateral primary osteoarthritis of knee: Secondary | ICD-10-CM | POA: Diagnosis not present

## 2023-01-22 DIAGNOSIS — E1165 Type 2 diabetes mellitus with hyperglycemia: Secondary | ICD-10-CM | POA: Diagnosis not present

## 2023-01-22 DIAGNOSIS — J4520 Mild intermittent asthma, uncomplicated: Secondary | ICD-10-CM | POA: Diagnosis not present

## 2023-01-22 DIAGNOSIS — E782 Mixed hyperlipidemia: Secondary | ICD-10-CM | POA: Diagnosis not present

## 2023-01-22 DIAGNOSIS — E1142 Type 2 diabetes mellitus with diabetic polyneuropathy: Secondary | ICD-10-CM | POA: Diagnosis not present

## 2023-01-22 DIAGNOSIS — Z0000 Encounter for general adult medical examination without abnormal findings: Secondary | ICD-10-CM | POA: Diagnosis not present

## 2023-01-22 DIAGNOSIS — I10 Essential (primary) hypertension: Secondary | ICD-10-CM | POA: Diagnosis not present

## 2023-01-22 DIAGNOSIS — E039 Hypothyroidism, unspecified: Secondary | ICD-10-CM | POA: Diagnosis not present

## 2023-01-22 DIAGNOSIS — Z1159 Encounter for screening for other viral diseases: Secondary | ICD-10-CM | POA: Diagnosis not present

## 2023-02-23 DIAGNOSIS — J4520 Mild intermittent asthma, uncomplicated: Secondary | ICD-10-CM | POA: Diagnosis not present

## 2023-05-02 DIAGNOSIS — H905 Unspecified sensorineural hearing loss: Secondary | ICD-10-CM | POA: Diagnosis not present

## 2023-05-02 DIAGNOSIS — R350 Frequency of micturition: Secondary | ICD-10-CM | POA: Diagnosis not present

## 2023-05-02 DIAGNOSIS — R3911 Hesitancy of micturition: Secondary | ICD-10-CM | POA: Diagnosis not present

## 2023-05-21 DIAGNOSIS — J4520 Mild intermittent asthma, uncomplicated: Secondary | ICD-10-CM | POA: Diagnosis not present

## 2023-08-10 DIAGNOSIS — J4520 Mild intermittent asthma, uncomplicated: Secondary | ICD-10-CM | POA: Diagnosis not present

## 2023-12-26 NOTE — Progress Notes (Addendum)
 SHANAN MCMILLER                                          MRN: [id number]   01/29/2024   The [facility] Quality Team Specialist reviewed this patient medical record for the purposes of chart review for care gap closure. The following were reviewed: chart review for care gap closure-kidney health evaluation for diabetes:eGFR  and uACR.    [facility] Quality Team

## 2024-02-05 ENCOUNTER — Emergency Department (HOSPITAL_COMMUNITY)
Admission: EM | Admit: 2024-02-05 | Discharge: 2024-02-05 | Discharge status: Against Medical Advice | Attending: Emergency Medicine | Admitting: Emergency Medicine

## 2024-02-05 ENCOUNTER — Emergency Department (HOSPITAL_COMMUNITY)

## 2024-02-05 ENCOUNTER — Other Ambulatory Visit: Payer: Self-pay

## 2024-02-05 DIAGNOSIS — R0602 Shortness of breath: Secondary | ICD-10-CM | POA: Diagnosis not present

## 2024-02-05 DIAGNOSIS — R531 Weakness: Secondary | ICD-10-CM | POA: Diagnosis not present

## 2024-02-05 DIAGNOSIS — R600 Localized edema: Secondary | ICD-10-CM | POA: Diagnosis not present

## 2024-02-05 DIAGNOSIS — Z5321 Procedure and treatment not carried out due to patient leaving prior to being seen by health care provider: Secondary | ICD-10-CM | POA: Diagnosis not present

## 2024-02-05 DIAGNOSIS — H5789 Other specified disorders of eye and adnexa: Secondary | ICD-10-CM | POA: Diagnosis present

## 2024-02-05 LAB — URINALYSIS, ROUTINE W REFLEX MICROSCOPIC
Bilirubin Urine: NEGATIVE
Glucose, UA: NEGATIVE mg/dL
Hgb urine dipstick: NEGATIVE
Ketones, ur: NEGATIVE mg/dL
Leukocytes,Ua: NEGATIVE
Nitrite: NEGATIVE
Protein, ur: NEGATIVE mg/dL
Specific Gravity, Urine: <1.005 — ABNORMAL LOW (ref 1.005–1.030)
pH: 7 (ref 5.0–8.0)

## 2024-02-05 LAB — COMPREHENSIVE METABOLIC PANEL WITH GFR
ALT: 7 U/L (ref 0–44)
AST: 22 U/L (ref 15–41)
Albumin: 3.6 g/dL (ref 3.5–5.0)
Alkaline Phosphatase: 50 U/L (ref 38–126)
Anion gap: 8 (ref 5–15)
BUN: 18 mg/dL (ref 8–23)
CO2: 33 mmol/L — ABNORMAL HIGH (ref 22–32)
Calcium: 9.6 mg/dL (ref 8.9–10.3)
Chloride: 99 mmol/L (ref 98–111)
Creatinine, Ser: 0.87 mg/dL (ref 0.61–1.24)
GFR, Estimated: >60 mL/min (ref >60)
Glucose, Bld: 132 mg/dL — ABNORMAL HIGH (ref 70–99)
Potassium: 4.3 mmol/L (ref 3.5–5.1)
Sodium: 140 mmol/L (ref 135–145)
Total Bilirubin: 0.4 mg/dL (ref 0.0–1.2)
Total Protein: 6.6 g/dL (ref 6.5–8.1)

## 2024-02-05 LAB — RESP PANEL BY RT-PCR (RSV, FLU A&B, COVID)  RVPGX2
Influenza A by PCR: NEGATIVE
Influenza B by PCR: NEGATIVE
Resp Syncytial Virus by PCR: NEGATIVE
SARS Coronavirus 2 by RT PCR: NEGATIVE

## 2024-02-05 LAB — CBC WITH DIFFERENTIAL/PLATELET
Abs Immature Granulocytes: 0.02 K/uL (ref 0.00–0.07)
Basophils Absolute: 0 K/uL (ref 0.0–0.1)
Basophils Relative: 1 %
Eosinophils Absolute: 0.3 K/uL (ref 0.0–0.5)
Eosinophils Relative: 6 %
HCT: 41 % (ref 39.0–52.0)
Hemoglobin: 13.3 g/dL (ref 13.0–17.0)
Immature Granulocytes: 0 %
Lymphocytes Relative: 15 %
Lymphs Abs: 0.7 K/uL (ref 0.7–4.0)
MCH: 31.1 pg (ref 26.0–34.0)
MCHC: 32.4 g/dL (ref 30.0–36.0)
MCV: 95.8 fL (ref 80.0–100.0)
Monocytes Absolute: 0.5 K/uL (ref 0.1–1.0)
Monocytes Relative: 10 %
Neutro Abs: 3.2 K/uL (ref 1.7–7.7)
Neutrophils Relative %: 68 %
Platelets: 163 K/uL (ref 150–400)
RBC: 4.28 MIL/uL (ref 4.22–5.81)
RDW: 13.6 % (ref 11.5–15.5)
WBC: 4.7 K/uL (ref 4.0–10.5)
nRBC: 0 % (ref 0.0–0.2)

## 2024-02-05 LAB — PRO BRAIN NATRIURETIC PEPTIDE: Pro Brain Natriuretic Peptide: 856 pg/mL — ABNORMAL HIGH (ref <300.0)

## 2024-02-05 LAB — TROPONIN T, HIGH SENSITIVITY: Troponin T High Sensitivity: 33 ng/L — ABNORMAL HIGH (ref 0–19)

## 2024-02-05 NOTE — ED Provider Triage Note (Signed)
 Emergency Medicine Provider Triage Evaluation Note  Cameron Moran , a 73 y.o. male  was evaluated in triage.  Pt complains of generalized weakness for the past 2 days, itchy bilateral eyes with clear drainage as well as exertional shortness of breath and leg swelling.  Denies any acute fever or sick contacts.  No cough.  No active chest pain.  Review of Systems  Positive: Weakness, shortness of breath, leg swelling, bilateral eye itchiness/clear drainage Negative: Chest pain, fever  Physical Exam  BP 133/73 (BP Location: Left Arm)   Pulse 69   Temp 97.8 F (36.6 C)   Resp 20   Wt (!) 181.4 kg   SpO2 93%   BMI 58.22 kg/m  Gen:   Awake, no distress, mild bilateral upper and lower lid erythema with clear drainage, very mildly injected conjunctive a, EOM intact Resp:  Normal effort, on 2 L nasal cannula, diminished breath sounds at bases MSK:   Moves extremities without difficulty, mild pitting edema of the bilateral lower extremities   Medical Decision Making  Medically screening exam initiated at 1:48 PM.  Appropriate orders placed.  Cameron Moran was informed that the remainder of the evaluation will be completed by another provider, this initial triage assessment does not replace that evaluation, and the importance of remaining in the ED until their evaluation is complete.  73 year old male presents emergency department with generalized weakness, bilateral itchy/red eyes with clear drainage and shortness of breath/leg swelling for the past 2 to 3 days.  Was symptomatically short of breath and placed on 2 L nasal cannula with diminished breath sounds at bases.  Patient evaluated sitting up in chair, fully clothed, limited PE. Orders placed. Patient was counseled that they need to remain in the ED until the completion of their work-up including a full H&P, additional testing and results of any tests.  The patient appears stable and the remainder of the encounter may be completed by  another provider.   Cameron Roxie HERO, DO 02/05/24 1350

## 2024-02-05 NOTE — ED Notes (Signed)
 Pt stated he was not going to wait any longer and wife was here to pick him up. IV removed. Moved OTF.

## 2024-02-05 NOTE — ED Triage Notes (Signed)
 PT BIB EMS from home with complaints of generalized weakness x 2 day and red, itchy, draining eyes x 3 days. Also complains of SOB worse on exertion. Ambulatory with EMS.   EMS vitals 140/80 84 hr 93% on RA placed on 2l and improved to 98%
